# Patient Record
Sex: Male | Born: 1970 | Race: White | Hispanic: No | Smoking: Former smoker
Health system: Southern US, Community
[De-identification: ages and names within clinical notes are randomized; demographics above are authoritative.]

---

## 1999-10-10 ENCOUNTER — Emergency Department (HOSPITAL_COMMUNITY): Admission: EM | Admit: 1999-10-10 | Discharge: 1999-10-10 | Payer: Self-pay | Admitting: Emergency Medicine

## 1999-10-10 ENCOUNTER — Encounter: Payer: Self-pay | Admitting: Emergency Medicine

## 2003-07-15 ENCOUNTER — Ambulatory Visit (HOSPITAL_COMMUNITY): Admission: RE | Admit: 2003-07-15 | Discharge: 2003-07-15 | Payer: Self-pay | Admitting: Internal Medicine

## 2019-03-31 ENCOUNTER — Other Ambulatory Visit: Payer: Self-pay

## 2019-03-31 DIAGNOSIS — Z20822 Contact with and (suspected) exposure to covid-19: Secondary | ICD-10-CM

## 2019-04-01 LAB — NOVEL CORONAVIRUS, NAA: SARS-CoV-2, NAA: NOT DETECTED

## 2020-01-29 ENCOUNTER — Encounter (HOSPITAL_BASED_OUTPATIENT_CLINIC_OR_DEPARTMENT_OTHER): Payer: Self-pay

## 2020-01-29 ENCOUNTER — Other Ambulatory Visit: Payer: Self-pay

## 2020-01-29 ENCOUNTER — Emergency Department (HOSPITAL_BASED_OUTPATIENT_CLINIC_OR_DEPARTMENT_OTHER): Payer: 59

## 2020-01-29 ENCOUNTER — Inpatient Hospital Stay (HOSPITAL_BASED_OUTPATIENT_CLINIC_OR_DEPARTMENT_OTHER)
Admission: EM | Admit: 2020-01-29 | Discharge: 2020-02-05 | DRG: 177 | Disposition: A | Discharge status: Home / Self Care | Payer: 59 | Attending: Internal Medicine | Admitting: Internal Medicine

## 2020-01-29 DIAGNOSIS — E860 Dehydration: Secondary | ICD-10-CM | POA: Diagnosis present

## 2020-01-29 DIAGNOSIS — R112 Nausea with vomiting, unspecified: Secondary | ICD-10-CM | POA: Diagnosis present

## 2020-01-29 DIAGNOSIS — R197 Diarrhea, unspecified: Secondary | ICD-10-CM | POA: Diagnosis present

## 2020-01-29 DIAGNOSIS — Z833 Family history of diabetes mellitus: Secondary | ICD-10-CM

## 2020-01-29 DIAGNOSIS — U071 COVID-19: Secondary | ICD-10-CM | POA: Diagnosis not present

## 2020-01-29 DIAGNOSIS — J9601 Acute respiratory failure with hypoxia: Secondary | ICD-10-CM | POA: Diagnosis not present

## 2020-01-29 DIAGNOSIS — E873 Alkalosis: Secondary | ICD-10-CM | POA: Diagnosis present

## 2020-01-29 DIAGNOSIS — E871 Hypo-osmolality and hyponatremia: Secondary | ICD-10-CM | POA: Diagnosis present

## 2020-01-29 DIAGNOSIS — J1282 Pneumonia due to coronavirus disease 2019: Secondary | ICD-10-CM | POA: Diagnosis present

## 2020-01-29 LAB — CBC WITH DIFFERENTIAL/PLATELET
Abs Immature Granulocytes: 0.03 10*3/uL (ref 0.00–0.07)
Basophils Absolute: 0 10*3/uL (ref 0.0–0.1)
Basophils Relative: 0 %
Eosinophils Absolute: 0 10*3/uL (ref 0.0–0.5)
Eosinophils Relative: 0 %
HCT: 45.5 % (ref 39.0–52.0)
Hemoglobin: 15.6 g/dL (ref 13.0–17.0)
Immature Granulocytes: 1 %
Lymphocytes Relative: 12 %
Lymphs Abs: 0.7 10*3/uL (ref 0.7–4.0)
MCH: 31.6 pg (ref 26.0–34.0)
MCHC: 34.3 g/dL (ref 30.0–36.0)
MCV: 92.3 fL (ref 80.0–100.0)
Monocytes Absolute: 0.4 10*3/uL (ref 0.1–1.0)
Monocytes Relative: 6 %
Neutro Abs: 4.8 10*3/uL (ref 1.7–7.7)
Neutrophils Relative %: 81 %
Platelets: 215 10*3/uL (ref 150–400)
RBC: 4.93 MIL/uL (ref 4.22–5.81)
RDW: 12.9 % (ref 11.5–15.5)
WBC: 5.8 10*3/uL (ref 4.0–10.5)
nRBC: 0 % (ref 0.0–0.2)

## 2020-01-29 LAB — COMPREHENSIVE METABOLIC PANEL
ALT: 163 U/L — ABNORMAL HIGH (ref 0–44)
AST: 162 U/L — ABNORMAL HIGH (ref 15–41)
Albumin: 3.2 g/dL — ABNORMAL LOW (ref 3.5–5.0)
Alkaline Phosphatase: 86 U/L (ref 38–126)
Anion gap: 11 (ref 5–15)
BUN: 10 mg/dL (ref 6–20)
CO2: 31 mmol/L (ref 22–32)
Calcium: 8.1 mg/dL — ABNORMAL LOW (ref 8.9–10.3)
Chloride: 88 mmol/L — ABNORMAL LOW (ref 98–111)
Creatinine, Ser: 0.96 mg/dL (ref 0.61–1.24)
GFR calc Af Amer: >60 mL/min (ref >60)
GFR calc non Af Amer: >60 mL/min (ref >60)
Glucose, Bld: 117 mg/dL — ABNORMAL HIGH (ref 70–99)
Potassium: 3.7 mmol/L (ref 3.5–5.1)
Sodium: 130 mmol/L — ABNORMAL LOW (ref 135–145)
Total Bilirubin: 0.9 mg/dL (ref 0.3–1.2)
Total Protein: 7.6 g/dL (ref 6.5–8.1)

## 2020-01-29 LAB — LACTIC ACID, PLASMA: Lactic Acid, Venous: 1.7 mmol/L (ref 0.5–1.9)

## 2020-01-29 LAB — D-DIMER, QUANTITATIVE: D-Dimer, Quant: 0.86 ug/mL-FEU — ABNORMAL HIGH (ref 0.00–0.50)

## 2020-01-29 IMAGING — DX DG CHEST 1V PORT
1 series · 1 of 1 positions shown · non-contrast
Comparison: None.

CLINICAL DATA: [5C] positive, shortness of breath

EXAM:
PORTABLE CHEST 1 VIEW

[chest ap]
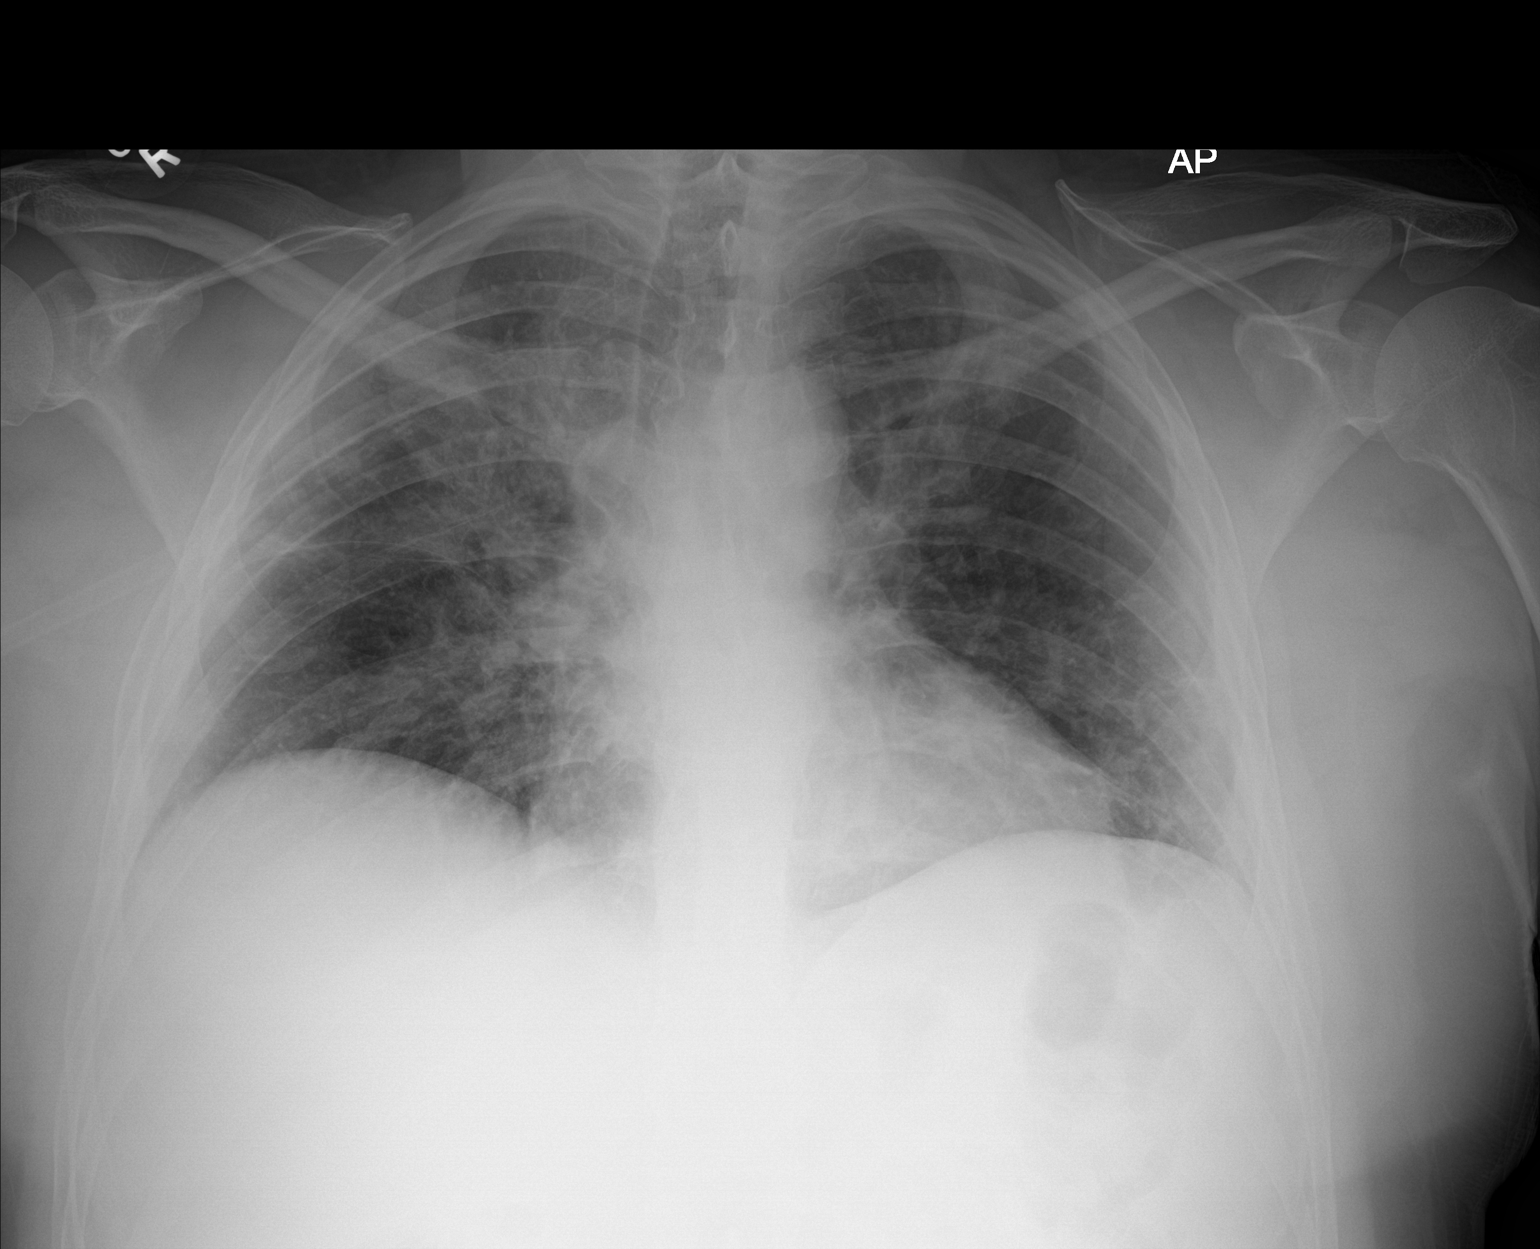

[1 of 1 positions shown; findings below may reference images not displayed]

FINDINGS: Single frontal view of the chest demonstrates multifocal airspace
disease greatest in the right upper lung zone. No effusion or
pneumothorax. Cardiac silhouette is unremarkable.
IMPRESSION: 1. Multifocal bilateral pneumonia compatible with COVID 19.

## 2020-01-29 MED ORDER — ONDANSETRON HCL 4 MG/2ML IJ SOLN
4.0000 mg | Freq: Once | INTRAMUSCULAR | Status: AC
  Administered 2020-01-29: 4 mg via INTRAVENOUS
  Filled 2020-01-29: qty 2

## 2020-01-29 MED ORDER — SODIUM CHLORIDE 0.9 % IV SOLN
100.0000 mg | Freq: Every day | INTRAVENOUS | Status: AC
  Administered 2020-01-30 – 2020-02-02 (×4): 100 mg via INTRAVENOUS
  Filled 2020-01-29 (×4): qty 20

## 2020-01-29 MED ORDER — DEXAMETHASONE SODIUM PHOSPHATE 10 MG/ML IJ SOLN
10.0000 mg | Freq: Once | INTRAMUSCULAR | Status: AC
  Administered 2020-01-29: 10 mg via INTRAVENOUS
  Filled 2020-01-29: qty 1

## 2020-01-29 MED ORDER — ACETAMINOPHEN 500 MG PO TABS
1000.0000 mg | ORAL_TABLET | Freq: Once | ORAL | Status: AC
  Administered 2020-01-29: 1000 mg via ORAL
  Filled 2020-01-29: qty 2

## 2020-01-29 MED ORDER — SODIUM CHLORIDE 0.9 % IV SOLN
100.0000 mg | Freq: Once | INTRAVENOUS | Status: AC
  Administered 2020-01-29: 100 mg via INTRAVENOUS
  Filled 2020-01-29: qty 20

## 2020-01-29 NOTE — ED Triage Notes (Addendum)
Pt c/o SOB x today-states he tested +covid 9/15-states O2 sat was staying mid to high 90s- today was 85-NAD-steady gait

## 2020-01-29 NOTE — ED Notes (Signed)
Patient placed on 3L [place] with humidity to maintain an oxygen saturation of 93-95%. RR 20, Will access and make necessary changes.

## 2020-01-29 NOTE — ED Provider Notes (Signed)
[facility] EMERGENCY DEPARTMENT Provider Note   CSN: [id number] Arrival date & time: 01/29/20  1909     History Chief Complaint  Patient presents with  . Shortness of Breath    JAHMAI FINELLI is a 49 y.o. male.  HPI    49 year old male presenting for evaluation of shortness of breath.  States that he was diagnosed with Covid on 9/15.  He became symptomatic on the 14th.  He reports that he has had fevers, nausea, vomiting, diarrhea, cough, shortness of breath.  States that symptoms worsened today and shortness of breath was more severe.  His pulse ox at home was reading in the 90s.  He denies any chest pain unless he is coughing.  He is not vaccinated and has not received a Mab infusion.   History reviewed. No pertinent past medical history.  Patient Active Problem List   Diagnosis Date Noted  . Acute hypoxemic respiratory failure due to COVID-19 [facility]) 01/29/2020    History reviewed. No pertinent surgical history.     No family history on file.  Social History   Tobacco Use  . Smoking status: Never Smoker  . Smokeless tobacco: Never Used  Vaping Use  . Vaping Use: Never used  Substance Use Topics  . Alcohol use: Yes    Comment: occ  . Drug use: Never    Home Medications Prior to Admission medications   Medication Sig Start Date End Date Taking? Authorizing Provider  albuterol (VENTOLIN HFA) 108 (90 Base) MCG/ACT inhaler  01/27/20   [provider]    Allergies    Patient has no known allergies.  Review of Systems   Review of Systems  Constitutional: Positive for fever.  HENT: Negative for ear pain and sore throat.   Eyes: Negative for pain and visual disturbance.  Respiratory: Positive for cough and shortness of breath.   Cardiovascular: Negative for chest pain.  Gastrointestinal: Positive for diarrhea and vomiting. Negative for abdominal pain and nausea.  Genitourinary: Negative for dysuria and hematuria.  Musculoskeletal: Negative for  back pain.  Skin: Negative for rash.  Neurological: Positive for headaches. Negative for seizures and syncope.  All other systems reviewed and are negative.   Physical Exam Updated Vital Signs BP 136/90   Pulse (!) 107   Temp 99.9 F (37.7 C) (Oral)   Resp 14   Ht 5\' 9"  (1.753 m)   Wt 95.3 kg   SpO2 94%   BMI 31.01 kg/m   Physical Exam Vitals and nursing note reviewed.  Constitutional:      Appearance: He is well-developed.  HENT:     Head: Normocephalic and atraumatic.  Eyes:     Conjunctiva/sclera: Conjunctivae normal.  Cardiovascular:     Rate and Rhythm: Regular rhythm. Tachycardia present.     Heart sounds: Normal heart sounds. No murmur heard.   Pulmonary:     Effort: Tachypnea present. No respiratory distress.     Breath sounds: Examination of the right-middle field reveals rales. Examination of the left-middle field reveals rales. Examination of the right-lower field reveals rales. Examination of the left-lower field reveals rales. Rales present.     Comments: Speaking in 1-2 word sentences Abdominal:     Palpations: Abdomen is soft.     Tenderness: There is no abdominal tenderness. There is no guarding or rebound.  Musculoskeletal:     Cervical back: Neck supple.  Skin:    General: Skin is warm and dry.  Neurological:  Mental Status: He is alert.     ED Results / Procedures / Treatments   Labs (all labs ordered are listed, but only abnormal results are displayed) Labs Reviewed  COMPREHENSIVE METABOLIC PANEL - Abnormal; Notable for the following components:      Result Value   Sodium 130 (*)    Chloride 88 (*)    Glucose, Bld 117 (*)    Calcium 8.1 (*)    Albumin 3.2 (*)    AST 162 (*)    ALT 163 (*)    All other components within normal limits  D-DIMER, QUANTITATIVE (NOT AT [facility]) - Abnormal; Notable for the following components:   D-Dimer, Quant 0.86 (*)    All other components within normal limits  SARS CORONAVIRUS 2 BY RT PCR (HOSPITAL  ORDER, PERFORMED IN [place] [facility])  CULTURE, BLOOD (ROUTINE X 2)  CULTURE, BLOOD (ROUTINE X 2)  LACTIC ACID, PLASMA  CBC WITH DIFFERENTIAL/PLATELET  LACTIC ACID, PLASMA  PROCALCITONIN  LACTATE DEHYDROGENASE  FERRITIN  TRIGLYCERIDES  FIBRINOGEN  C-REACTIVE PROTEIN    EKG EKG Interpretation  Date/Time:  Friday January 29 2020 21:46:37 EDT Ventricular Rate:  111 PR Interval:    QRS Duration: 96 QT Interval:  342 QTC Calculation: 465 R Axis:   -76 Text Interpretation: Sinus tachycardia Left anterior fascicular block No old tracing to compare Confirmed by Susy Frizzle [phone]) on 01/29/2020 10:06:14 PM   Radiology DG Chest Portable 1 View  Result Date: 01/29/2020 CLINICAL DATA:  COVID-19 positive, shortness of breath EXAM: PORTABLE CHEST 1 VIEW COMPARISON:  None. FINDINGS: Single frontal view of the chest demonstrates multifocal airspace disease greatest in the right upper lung zone. No effusion or pneumothorax. Cardiac silhouette is unremarkable. IMPRESSION: 1. Multifocal bilateral pneumonia compatible with COVID 19. Electronically Signed   By: Sharlet Salina M.D.   On: 01/29/2020 21:49    Procedures Procedures (including critical care time)  CRITICAL CARE Performed by: Karrie Meres   Total critical care time: 31 minutes  Critical care time was exclusive of separately billable procedures and treating other patients.  Critical care was necessary to treat or prevent imminent or life-threatening deterioration.  Critical care was time spent personally by me on the following activities: development of treatment plan with patient and/or surrogate as well as nursing, discussions with consultants, evaluation of patient's response to treatment, examination of patient, obtaining history from patient or surrogate, ordering and performing treatments and interventions, ordering and review of laboratory studies, ordering and review of radiographic studies, pulse  oximetry and re-evaluation of patient's condition.   Medications Ordered in ED Medications  acetaminophen (TYLENOL) tablet 1,000 mg (has no administration in time range)  dexamethasone (DECADRON) injection 10 mg (has no administration in time range)  ondansetron (ZOFRAN) injection 4 mg (has no administration in time range)  remdesivir 100 mg in sodium chloride 0.9 % 100 mL IVPB (has no administration in time range)    Followed by  remdesivir 100 mg in sodium chloride 0.9 % 100 mL IVPB (has no administration in time range)    Followed by  remdesivir 100 mg in sodium chloride 0.9 % 100 mL IVPB (has no administration in time range)    ED Course  I have reviewed the triage vital signs and the nursing notes.  Pertinent labs & imaging results that were available during my care of the patient were reviewed by me and considered in my medical decision making (see chart for details).    MDM Rules/Calculators/A&P  49 year old male with known Covid infection presenting for evaluation of shortness of breath.  On arrival sats in the 80s on room air.  Requiring 4 L of fluid and later was placed on humidified O2.  Reviewed/interpreted labs CBC is without leukocytosis or anemia CMP with hyponatremia and hypochloremia.  Creatinine within normal limits.  LFTs are somewhat elevated but bilirubin is normal Lactic acid negative  EKG - Sinus tachycardia Left anterior fascicular block No old tracing to compare   Chest x-ray reviewed/interpreted - Multifocal bilateral pneumonia compatible with COVID 19.  Patient with Covid with acute hypoxic respiratory failure.  We will plan for admission for further treatment. Decadron, remdesivir, tylenol and zofran given in the ED.   10:38 PM CONSULT with Dr. Antionette Char with hospitalist service who accepts patient for admission   -----  Waymond Cera was evaluated in Emergency Department on 01/29/2020 for the symptoms described in the history of  present illness. He was evaluated in the context of the global COVID-19 pandemic, which necessitated consideration that the patient might be at risk for infection with the SARS-CoV-2 virus that causes COVID-19. Institutional protocols and algorithms that pertain to the evaluation of patients at risk for COVID-19 are in a state of rapid change based on information released by regulatory bodies including the CDC and federal and state organizations. These policies and algorithms were followed during the patient's care in the ED.   Final Clinical Impression(s) / ED Diagnoses Final diagnoses:  Acute respiratory failure with hypoxia [facility])  COVID-19    Rx / DC Orders ED Discharge Orders    None       Rayne Du 01/29/20 2239    Pollyann Savoy, MD 01/29/20 [phone]

## 2020-01-30 ENCOUNTER — Encounter (HOSPITAL_COMMUNITY): Payer: Self-pay | Admitting: Family Medicine

## 2020-01-30 DIAGNOSIS — E860 Dehydration: Secondary | ICD-10-CM

## 2020-01-30 DIAGNOSIS — R112 Nausea with vomiting, unspecified: Secondary | ICD-10-CM | POA: Diagnosis present

## 2020-01-30 DIAGNOSIS — E871 Hypo-osmolality and hyponatremia: Secondary | ICD-10-CM | POA: Diagnosis present

## 2020-01-30 DIAGNOSIS — U071 COVID-19: Secondary | ICD-10-CM | POA: Diagnosis present

## 2020-01-30 DIAGNOSIS — J9601 Acute respiratory failure with hypoxia: Secondary | ICD-10-CM | POA: Diagnosis present

## 2020-01-30 DIAGNOSIS — J1282 Pneumonia due to coronavirus disease 2019: Secondary | ICD-10-CM | POA: Diagnosis present

## 2020-01-30 DIAGNOSIS — E873 Alkalosis: Secondary | ICD-10-CM | POA: Diagnosis present

## 2020-01-30 DIAGNOSIS — Z833 Family history of diabetes mellitus: Secondary | ICD-10-CM | POA: Diagnosis not present

## 2020-01-30 DIAGNOSIS — R197 Diarrhea, unspecified: Secondary | ICD-10-CM | POA: Diagnosis present

## 2020-01-30 LAB — HIV ANTIBODY (ROUTINE TESTING W REFLEX): HIV Screen 4th Generation wRfx: NONREACTIVE

## 2020-01-30 LAB — MAGNESIUM: Magnesium: 2.8 mg/dL — ABNORMAL HIGH (ref 1.7–2.4)

## 2020-01-30 LAB — CBC WITH DIFFERENTIAL/PLATELET
Abs Immature Granulocytes: 0.02 10*3/uL (ref 0.00–0.07)
Basophils Absolute: 0 10*3/uL (ref 0.0–0.1)
Basophils Relative: 0 %
Eosinophils Absolute: 0 10*3/uL (ref 0.0–0.5)
Eosinophils Relative: 0 %
HCT: 44.3 % (ref 39.0–52.0)
Hemoglobin: 15.3 g/dL (ref 13.0–17.0)
Immature Granulocytes: 0 %
Lymphocytes Relative: 8 %
Lymphs Abs: 0.5 10*3/uL — ABNORMAL LOW (ref 0.7–4.0)
MCH: 32 pg (ref 26.0–34.0)
MCHC: 34.5 g/dL (ref 30.0–36.0)
MCV: 92.7 fL (ref 80.0–100.0)
Monocytes Absolute: 0.3 10*3/uL (ref 0.1–1.0)
Monocytes Relative: 5 %
Neutro Abs: 4.8 10*3/uL (ref 1.7–7.7)
Neutrophils Relative %: 87 %
Platelets: 222 10*3/uL (ref 150–400)
RBC: 4.78 MIL/uL (ref 4.22–5.81)
RDW: 13 % (ref 11.5–15.5)
WBC: 5.5 10*3/uL (ref 4.0–10.5)
nRBC: 0 % (ref 0.0–0.2)

## 2020-01-30 LAB — COMPREHENSIVE METABOLIC PANEL
ALT: 145 U/L — ABNORMAL HIGH (ref 0–44)
AST: 131 U/L — ABNORMAL HIGH (ref 15–41)
Albumin: 3.1 g/dL — ABNORMAL LOW (ref 3.5–5.0)
Alkaline Phosphatase: 85 U/L (ref 38–126)
Anion gap: 11 (ref 5–15)
BUN: 11 mg/dL (ref 6–20)
CO2: 28 mmol/L (ref 22–32)
Calcium: 8.2 mg/dL — ABNORMAL LOW (ref 8.9–10.3)
Chloride: 92 mmol/L — ABNORMAL LOW (ref 98–111)
Creatinine, Ser: 0.73 mg/dL (ref 0.61–1.24)
GFR calc Af Amer: >60 mL/min (ref >60)
GFR calc non Af Amer: >60 mL/min (ref >60)
Glucose, Bld: 166 mg/dL — ABNORMAL HIGH (ref 70–99)
Potassium: 4 mmol/L (ref 3.5–5.1)
Sodium: 131 mmol/L — ABNORMAL LOW (ref 135–145)
Total Bilirubin: 0.7 mg/dL (ref 0.3–1.2)
Total Protein: 7.1 g/dL (ref 6.5–8.1)

## 2020-01-30 LAB — FERRITIN: Ferritin: 5736 ng/mL — ABNORMAL HIGH (ref 24–336)

## 2020-01-30 LAB — C-REACTIVE PROTEIN
CRP: 9.5 mg/dL — ABNORMAL HIGH (ref <1.0)
CRP: 11 mg/dL — ABNORMAL HIGH (ref <1.0)

## 2020-01-30 LAB — LACTIC ACID, PLASMA: Lactic Acid, Venous: 1.1 mmol/L (ref 0.5–1.9)

## 2020-01-30 LAB — TRIGLYCERIDES: Triglycerides: 291 mg/dL — ABNORMAL HIGH (ref <150)

## 2020-01-30 LAB — PROCALCITONIN: Procalcitonin: 0.33 ng/mL

## 2020-01-30 LAB — D-DIMER, QUANTITATIVE: D-Dimer, Quant: 0.78 ug/mL-FEU — ABNORMAL HIGH (ref 0.00–0.50)

## 2020-01-30 LAB — SARS CORONAVIRUS 2 BY RT PCR (HOSPITAL ORDER, PERFORMED IN ~~LOC~~ HOSPITAL LAB): SARS Coronavirus 2: POSITIVE — AB

## 2020-01-30 LAB — FIBRINOGEN: Fibrinogen: 529 mg/dL — ABNORMAL HIGH (ref 210–475)

## 2020-01-30 LAB — LACTATE DEHYDROGENASE: LDH: 577 U/L — ABNORMAL HIGH (ref 98–192)

## 2020-01-30 MED ORDER — ALBUTEROL SULFATE HFA 108 (90 BASE) MCG/ACT IN AERS: 2.0000 | INHALATION_SPRAY | RESPIRATORY_TRACT | Status: DC | PRN

## 2020-01-30 MED ORDER — ENOXAPARIN SODIUM 40 MG/0.4ML ~~LOC~~ SOLN
40.0000 mg | SUBCUTANEOUS | Status: DC
  Administered 2020-01-30 – 2020-02-05 (×7): 40 mg via SUBCUTANEOUS
  Filled 2020-01-30 (×7): qty 0.4

## 2020-01-30 MED ORDER — ONDANSETRON HCL 4 MG PO TABS: 4.0000 mg | ORAL_TABLET | Freq: Four times a day (QID) | ORAL | Status: DC | PRN

## 2020-01-30 MED ORDER — DEXAMETHASONE SODIUM PHOSPHATE 10 MG/ML IJ SOLN: 6.0000 mg | INTRAMUSCULAR | Status: DC

## 2020-01-30 MED ORDER — ZINC SULFATE 220 (50 ZN) MG PO CAPS
220.0000 mg | ORAL_CAPSULE | Freq: Every day | ORAL | Status: DC
  Administered 2020-01-30 – 2020-02-05 (×7): 220 mg via ORAL
  Filled 2020-01-30 (×7): qty 1

## 2020-01-30 MED ORDER — ONDANSETRON HCL 4 MG/2ML IJ SOLN: 4.0000 mg | Freq: Four times a day (QID) | INTRAMUSCULAR | Status: DC | PRN

## 2020-01-30 MED ORDER — ACETAMINOPHEN 325 MG PO TABS
650.0000 mg | ORAL_TABLET | Freq: Four times a day (QID) | ORAL | Status: DC | PRN
  Administered 2020-01-31: 650 mg via ORAL
  Filled 2020-01-30 (×2): qty 2

## 2020-01-30 MED ORDER — POLYETHYLENE GLYCOL 3350 17 G PO PACK: 17.0000 g | PACK | Freq: Every day | ORAL | Status: DC | PRN

## 2020-01-30 MED ORDER — ASCORBIC ACID 500 MG PO TABS
500.0000 mg | ORAL_TABLET | Freq: Every day | ORAL | Status: DC
  Administered 2020-01-30 – 2020-02-05 (×7): 500 mg via ORAL
  Filled 2020-01-30 (×6): qty 1

## 2020-01-30 MED ORDER — METHYLPREDNISOLONE SODIUM SUCC 125 MG IJ SOLR
50.0000 mg | Freq: Two times a day (BID) | INTRAMUSCULAR | Status: DC
  Administered 2020-01-30 – 2020-02-02 (×7): 50 mg via INTRAVENOUS
  Filled 2020-01-30 (×7): qty 2

## 2020-01-30 MED ORDER — BARICITINIB 2 MG PO TABS
4.0000 mg | ORAL_TABLET | Freq: Every day | ORAL | Status: DC
  Administered 2020-01-30 – 2020-02-05 (×7): 4 mg via ORAL
  Filled 2020-01-30 (×7): qty 2

## 2020-01-30 MED ORDER — GUAIFENESIN-DM 100-10 MG/5ML PO SYRP
10.0000 mL | ORAL_SOLUTION | ORAL | Status: DC | PRN
  Administered 2020-01-30 – 2020-01-31 (×2): 10 mL via ORAL
  Filled 2020-01-30 (×3): qty 10

## 2020-01-30 MED ORDER — IPRATROPIUM-ALBUTEROL 20-100 MCG/ACT IN AERS
1.0000 | INHALATION_SPRAY | Freq: Four times a day (QID) | RESPIRATORY_TRACT | Status: DC
  Administered 2020-01-30 – 2020-01-31 (×3): 1 via RESPIRATORY_TRACT
  Filled 2020-01-30: qty 4

## 2020-01-30 MED ORDER — LACTATED RINGERS IV SOLN: INTRAVENOUS | Status: DC

## 2020-01-30 NOTE — Progress Notes (Signed)
PROGRESS NOTE    Seth Lee  YJE:[id number] DOB: 18-Nov-1970 DOA: 01/29/2020 PCP: Pcp, No    Brief Narrative:  Patient admitted to the hospital working diagnosis of acute hypoxic respiratory failure due to SARS COVID-19 viral pneumonia.  49 year old male with no significant past medical history, transferred from [facility] due to acute hypoxic respiratory failure.  Patient was diagnosed with COVID-19 September 15, positive sick contacts at home.  Patient developed progressive dyspnea to the point where he became dyspneic with minimal efforts.  Persistent dry cough, generalized malaise, loss of taste and smell and poor oral intake.  He was seen at [facility] where he was found hypoxic, requiring 3 L of submental oxygen, blood pressure 110/85, heart rate 92, respiratory 25-29, temperature 98.6, oxygen saturation 92% on supplemental oxygen, lungs with Rales at bases bilaterally, no wheezing, heart S1-S2, present rhythmic, soft abdomen, no lower extremity edema. Sodium 130, potassium 3.7, chloride 88, bicarb 31, glucose 117, BUN 10, creatinine 0.96, white count 5.8, hemoglobin 15.6, hematocrit 45.5, platelets 215.  SARS COVID-19 positive. Chest radiograph with bilateral interstitial infiltrates, predominantly right upper lobe / left lower lobe and faint infiltrate at the right lower lobe.    Assessment & Plan:   Principal Problem:   Pneumonia due to COVID-19 virus Active Problems:   Acute respiratory failure with hypoxia (HCC)   Dehydration with hyponatremia   1. Acute hypoxic respiratory failure due to SARS COVID 19 viral pneumonia.    RR: 22  Pulse oxymetry: 89% to 92%  Fi02: 3L/min per [place]   COVID-19 Labs  Recent Labs    01/29/20 2120 01/30/20 0513  DDIMER 0.86* 0.78*  FERRITIN 5,736*  --   LDH 577*  --   CRP 9.5* 11.0*    Lab Results  Component Value Date   SARSCOV2NAA POSITIVE (A) 01/29/2020   SARSCOV2NAA Not Detected 03/31/2019   Patient with  high inflammatory markers and bilateral multilobar interstitial infiltrates.   In the setting of high risk for worsening hypoxic respiratory failure, I explained the patient in detail, the use of advanced therapy: baricitinib.  I informed him the risks of this interventions including immunosuppression, opportunistic infections, allergic reactions and others.  I explained him the experimental nature of this intervention, in the face of current COVID 19 pandemic, along with the potential benefits of improvement of hypoxic respiratory failure and systemic inflammation.  He has agreed to proceed and consent was signed.  Start baricitinib 4 mg po daily, change steroid therapy to methylprednisolone and continue antiviral therapy with Remdesivir.  Continue aggressive bronchodilator therapy, antitussive agents and airway clearing techniques with flutter valve and incentive spirometer.   Follow on inflammatory markers.   2. Hyponatremia, dehydration and metabolic alkalosis. Stable renal function with serum cr at 0.73, K up to 4,0 and Na 131, bicarbonate 28.  Patient is tolerating po, no nausea or vomiting, will dc IV fluids for now in the setting of acute viral pneumonia.     Patient continue to be at high risk for worsening hypoxemic respiratory failure   Status is: Inpatient  Remains inpatient appropriate because:IV treatments appropriate due to intensity of illness or inability to take PO   Dispo: The patient is from: Home              Anticipated d/c is to: Home              Anticipated d/c date is: > 3 days  Patient currently is not medically stable to d/c.   DVT prophylaxis: Enoxaparin   Code Status:   full  Family Communication:  Not able to reach his sister Mrs. Berkheart [phone]     Subjective: Patient continue to be very fatigue and weak, no nausea or vomiting, no chest pain, continue to have dyspnea.   Objective: Vitals:   01/30/20 0200 01/30/20 0300 01/30/20  0346 01/30/20 0820  BP: 106/75 110/85 133/83 132/85  Pulse: 87 92 92 95  Resp: 10 (!) 29 (!) 25 (!) 22  Temp:   98.6 F (37 C) 98.9 F (37.2 C)  TempSrc:    Oral  SpO2: 95% 96% 92% (!) 89%  Weight:      Height:        Intake/Output Summary (Last 24 hours) at 01/30/2020 0905 Last data filed at 01/30/2020 0026 Gross per 24 hour  Intake 199.29 ml  Output --  Net 199.29 ml   Filed Weights   01/29/20 1929  Weight: 95.3 kg    Examination:   General: deconditioned and ill looking appearing  Neurology: Awake and alert, non focal  E ENT: no pallor, no icterus, oral mucosa moist Cardiovascular: No JVD. S1-S2 present, rhythmic, no gallops, rubs, or murmurs. No lower extremity edema. Pulmonary: positive breath sounds bilaterally, with no wheezing, rhonchi or rales. Gastrointestinal. Abdomen soft and non tender Skin. No rashes Musculoskeletal: no joint deformities     Data Reviewed: I have personally reviewed following labs and imaging studies  CBC: Recent Labs  Lab 01/29/20 2120 01/30/20 0513  WBC 5.8 5.5  NEUTROABS 4.8 4.8  HGB 15.6 15.3  HCT 45.5 44.3  MCV 92.3 92.7  PLT 215 222   Basic Metabolic Panel: Recent Labs  Lab 01/29/20 2120 01/30/20 0513  NA 130* 131*  K 3.7 4.0  CL 88* 92*  CO2 31 28  GLUCOSE 117* 166*  BUN 10 11  CREATININE 0.96 0.73  CALCIUM 8.1* 8.2*  MG  --  2.8*   GFR: Estimated Creatinine Clearance: 127.2 mL/min (by C-G formula based on SCr of 0.73 mg/dL). Liver Function Tests: Recent Labs  Lab 01/29/20 2120 01/30/20 0513  AST 162* 131*  ALT 163* 145*  ALKPHOS 86 85  BILITOT 0.9 0.7  PROT 7.6 7.1  ALBUMIN 3.2* 3.1*   No results for input(s): LIPASE, AMYLASE in the last 168 hours. No results for input(s): AMMONIA in the last 168 hours. Coagulation Profile: No results for input(s): INR, PROTIME in the last 168 hours. Cardiac Enzymes: No results for input(s): CKTOTAL, CKMB, CKMBINDEX, TROPONINI in the last 168 hours. BNP (last  3 results) No results for input(s): PROBNP in the last 8760 hours. HbA1C: No results for input(s): HGBA1C in the last 72 hours. CBG: No results for input(s): GLUCAP in the last 168 hours. Lipid Profile: Recent Labs    01/29/20 2120  TRIG 291*   Thyroid Function Tests: No results for input(s): TSH, T4TOTAL, FREET4, T3FREE, THYROIDAB in the last 72 hours. Anemia Panel: Recent Labs    01/29/20 2120  FERRITIN 5,736*      Radiology Studies: I have reviewed all of the imaging during this hospital visit personally     Scheduled Meds: . vitamin C  500 mg Oral Daily  . dexamethasone (DECADRON) injection  6 mg Intravenous Q24H  . enoxaparin (LOVENOX) injection  40 mg Subcutaneous Q24H  . zinc sulfate  220 mg Oral Daily   Continuous Infusions: . lactated ringers 125 mL/hr at 01/30/20 0647  .  remdesivir 100 mg in NS 100 mL       LOS: 0 days        Perina Salvaggio Annett Gula, MD

## 2020-01-30 NOTE — Plan of Care (Signed)
Patient is alert and oriented. Patient has been calm, cooperative, and very responsive today. Patient was working very hard to maintain adequate oxygenation therefore he was bumped up from 3L to 4L humidified [place] were patient is maintaining 88% to 91%. Patient is having little to no success with his Probation officer.  Will report to nurse Thayer Ohm RN  Problem: Education: Goal: Knowledge of General Education information will improve Description: Including pain rating scale, medication(s)/side effects and non-pharmacologic comfort measures Outcome: Progressing   Problem: Skin Integrity: Goal: Risk for impaired skin integrity will decrease Outcome: Progressing   Problem: Clinical Measurements: Goal: Diagnostic test results will improve Outcome: Not Progressing Goal: Respiratory complications will improve Outcome: Not Progressing

## 2020-01-30 NOTE — H&P (Addendum)
History and Physical    Seth Lee NWG:[id number] DOB: 1971/04/18 DOA: 01/29/2020  PCP: Pcp, No  Patient coming from: Home - [facility]   Chief Complaint:  Chief Complaint  Patient presents with  . Shortness of Breath     HPI:    49 year old male with no significant past medical history presenting to med [facility] emergency department planes of shortness of breath and cough.  Patient explains that on September 14 several days after he came in contact with both his daughter and mother who both were diagnosed with COVID-19 he began to experience cough and mild shortness of breath.  On September 15, presented to a local clinic where he was diagnosed with COVID-19 infection and was sent home for conservative management.  The days that followed, patient explains that he developed progressively worsening shortness of breath. Initially the shortness of breath was mild in severity but progressively became more severe. Shortness of breath is worse with exertion and improved with rest. With patient's worsening shortness of breath patient developed worsening nonproductive cough. Patient also complains of generalized muscle aches, lack of taste, lack of smell and poor appetite. As the patient's symptoms continue to worsen he developed associated worsening generalized weakness. Patient also complains of intermittent fevers throughout the span of time. Patient also endorses occasional bouts of watery diarrhea.  Patient symptoms continue to worsen until he eventually presented to med [facility] emergency department for evaluation. Upon evaluation at [facility], patient was found to be hypoxic requiring supplemental oxygen with 3 L of oxygen via nasal cannula. Confirmatory Covid PCR testing was performed and came back positive. Chest x-ray was performed revealing patchy bilateral infiltrates consistent with COVID-19 pneumonia. Patient was initiated on intravenous remdesivir and  intravenous steroids and the hospitalist group was then called to to accept the patient to [facility] long hospital for admission.  Review of Systems:   Review of Systems  Constitutional: Positive for fever and malaise/fatigue.  Respiratory: Positive for cough and shortness of breath.   Gastrointestinal: Positive for diarrhea.  Neurological: Positive for weakness.  All other systems reviewed and are negative.   History reviewed. No pertinent past medical history.  History reviewed. No pertinent surgical history.   reports that he has never smoked. He has never used smokeless tobacco. He reports current alcohol use. He reports that he does not use drugs.  No Known Allergies  Family History  Problem Relation Age of Onset  . Diabetes Father      Prior to Admission medications   Medication Sig Start Date End Date Taking? Authorizing Provider  albuterol (VENTOLIN HFA) 108 (90 Base) MCG/ACT inhaler  01/27/20   [provider]    Physical Exam: Vitals:   01/30/20 0130 01/30/20 0200 01/30/20 0300 01/30/20 0346  BP: 103/70 106/75 110/85 133/83  Pulse: 89 87 92 92  Resp: 19 10 (!) 29 (!) 25  Temp:    98.6 F (37 C)  TempSrc:      SpO2: 94% 95% 96% 92%  Weight:      Height:        Constitutional: Acute alert and oriented x3, no associated distress.   Skin: no rashes, no lesions, good skin turgor noted. Eyes: Pupils are equally reactive to light.  No evidence of scleral icterus or conjunctival pallor.  ENMT: Moist mucous membranes noted.  Posterior pharynx clear of any exudate or lesions.   Neck: normal, supple, no masses, no thyromegaly.  No evidence of jugular  venous distension.   Respiratory: Mild rales in the bilateral bases. No wheezing noted. Normal respiratory effort. No accessory muscle use.  Cardiovascular: Regular rate and rhythm, no murmurs / rubs / gallops. No extremity edema. 2+ pedal pulses. No carotid bruits.  Chest:   Nontender without crepitus or  deformity.   Back:   Nontender without crepitus or deformity. Abdomen: Abdomen is soft and nontender.  No evidence of intra-abdominal masses.  Positive bowel sounds noted in all quadrants.   Musculoskeletal: No joint deformity upper and lower extremities. Good ROM, no contractures. Normal muscle tone.  Neurologic: CN 2-12 grossly intact. Sensation intact.  Patient moving all 4 extremities spontaneously.  Patient is following all commands.  Patient is responsive to verbal stimuli.   Psychiatric: Patient exhibits normal mood with appropriate affect.  Patient seems to possess insight as to their current situation.     Labs on Admission: I have personally reviewed following labs and imaging studies -   CBC: Recent Labs  Lab 01/29/20 2120  WBC 5.8  NEUTROABS 4.8  HGB 15.6  HCT 45.5  MCV 92.3  PLT 215   Basic Metabolic Panel: Recent Labs  Lab 01/29/20 2120  NA 130*  K 3.7  CL 88*  CO2 31  GLUCOSE 117*  BUN 10  CREATININE 0.96  CALCIUM 8.1*   GFR: Estimated Creatinine Clearance: 106 mL/min (by C-G formula based on SCr of 0.96 mg/dL). Liver Function Tests: Recent Labs  Lab 01/29/20 2120  AST 162*  ALT 163*  ALKPHOS 86  BILITOT 0.9  PROT 7.6  ALBUMIN 3.2*   No results for input(s): LIPASE, AMYLASE in the last 168 hours. No results for input(s): AMMONIA in the last 168 hours. Coagulation Profile: No results for input(s): INR, PROTIME in the last 168 hours. Cardiac Enzymes: No results for input(s): CKTOTAL, CKMB, CKMBINDEX, TROPONINI in the last 168 hours. BNP (last 3 results) No results for input(s): PROBNP in the last 8760 hours. HbA1C: No results for input(s): HGBA1C in the last 72 hours. CBG: No results for input(s): GLUCAP in the last 168 hours. Lipid Profile: Recent Labs    01/29/20 2120  TRIG 291*   Thyroid Function Tests: No results for input(s): TSH, T4TOTAL, FREET4, T3FREE, THYROIDAB in the last 72 hours. Anemia Panel: Recent Labs    01/29/20 2120    FERRITIN 5,736*   Urine analysis: No results found for: COLORURINE, APPEARANCEUR, LABSPEC, PHURINE, GLUCOSEU, HGBUR, BILIRUBINUR, KETONESUR, PROTEINUR, UROBILINOGEN, NITRITE, LEUKOCYTESUR  Radiological Exams on Admission - Personally Reviewed: DG Chest Portable 1 View  Result Date: 01/29/2020 CLINICAL DATA:  COVID-19 positive, shortness of breath EXAM: PORTABLE CHEST 1 VIEW COMPARISON:  None. FINDINGS: Single frontal view of the chest demonstrates multifocal airspace disease greatest in the right upper lung zone. No effusion or pneumothorax. Cardiac silhouette is unremarkable. IMPRESSION: 1. Multifocal bilateral pneumonia compatible with COVID 19. Electronically Signed   By: Sharlet Salina M.D.   On: 01/29/2020 21:49    EKG: Personally reviewed.  Rhythm is sinus tachycardia with heart rate of 111 bpm. Left anterior fascicular block. No dynamic ST segment changes appreciated.  Assessment/Plan COVID-19 virus infection   Patient presenting with nearly 10-day history of progressively worsening shortness of breath, cough, poor oral intake and weakness.  Confirmatory COVID-19 PCR testing came back positive while patient was at [facility]. Chest x-ray revealing bilateral patchy infiltrates consistent with COVID-19 infection.  Patient suffering from concurrent acute hypoxic respiratory failure requiring supplemental oxygen via nasal cannula for target  oxygen saturations of 94 to 96%.  Patient has been initiated on intravenous remdesivir and dexamethasone  Providing patient with as needed bronchodilator therapy via MDI  Providing patient with zinc, vitamin C  Procalcitonin unremarkable and therefore there is no evidence of bacterial coinfection.   Active Problems:   Acute respiratory failure with hypoxia (HCC)  Secondary to COVID-19 infection/pneumonia.  Please see assessment and plan above    Dehydration with hyponatremia  Notable volume depletion with associated  hyponatremia secondary to poor oral intake over the past several days due to COVID-19 infection  Hydrating patient with intravenous isotonic fluids  Monitoring sodium levels with serial chemistries.     Code Status:  Full code Family Communication: deferred   Status is: Inpatient  Remains inpatient appropriate because:IV treatments appropriate due to intensity of illness or inability to take PO and Inpatient level of care appropriate due to severity of illness   Dispo: The patient is from: Home              Anticipated d/c is to: Home              Anticipated d/c date is: 3 days              Patient currently is not medically stable to d/c.        Marinda Elk MD [facility] Pager [phone]  If 7PM-7AM, please contact night-coverage [email or web address] Use universal [place] password for that web site. If you do not have the password, please call the hospital operator.  01/30/2020, 4:46 AM

## 2020-01-31 LAB — COMPREHENSIVE METABOLIC PANEL
ALT: 118 U/L — ABNORMAL HIGH (ref 0–44)
AST: 83 U/L — ABNORMAL HIGH (ref 15–41)
Albumin: 2.9 g/dL — ABNORMAL LOW (ref 3.5–5.0)
Alkaline Phosphatase: 80 U/L (ref 38–126)
Anion gap: 10 (ref 5–15)
BUN: 15 mg/dL (ref 6–20)
CO2: 29 mmol/L (ref 22–32)
Calcium: 8.1 mg/dL — ABNORMAL LOW (ref 8.9–10.3)
Chloride: 94 mmol/L — ABNORMAL LOW (ref 98–111)
Creatinine, Ser: 0.77 mg/dL (ref 0.61–1.24)
GFR calc Af Amer: >60 mL/min (ref >60)
GFR calc non Af Amer: >60 mL/min (ref >60)
Glucose, Bld: 185 mg/dL — ABNORMAL HIGH (ref 70–99)
Potassium: 3.7 mmol/L (ref 3.5–5.1)
Sodium: 133 mmol/L — ABNORMAL LOW (ref 135–145)
Total Bilirubin: 0.7 mg/dL (ref 0.3–1.2)
Total Protein: 6.9 g/dL (ref 6.5–8.1)

## 2020-01-31 LAB — C-REACTIVE PROTEIN: CRP: 4.9 mg/dL — ABNORMAL HIGH (ref <1.0)

## 2020-01-31 LAB — D-DIMER, QUANTITATIVE: D-Dimer, Quant: 0.48 ug/mL-FEU (ref 0.00–0.50)

## 2020-01-31 LAB — FERRITIN: Ferritin: 6152 ng/mL — ABNORMAL HIGH (ref 24–336)

## 2020-01-31 MED ORDER — IPRATROPIUM-ALBUTEROL 20-100 MCG/ACT IN AERS
1.0000 | INHALATION_SPRAY | Freq: Four times a day (QID) | RESPIRATORY_TRACT | Status: DC
  Administered 2020-01-31 – 2020-02-02 (×12): 1 via RESPIRATORY_TRACT
  Filled 2020-01-31: qty 4

## 2020-01-31 MED ORDER — ALUM & MAG HYDROXIDE-SIMETH 200-200-20 MG/5ML PO SUSP
30.0000 mL | ORAL | Status: DC | PRN
  Administered 2020-01-31 – 2020-02-01 (×2): 30 mL via ORAL
  Filled 2020-01-31 (×2): qty 30

## 2020-01-31 MED ORDER — PANTOPRAZOLE SODIUM 40 MG PO TBEC
40.0000 mg | DELAYED_RELEASE_TABLET | Freq: Every day | ORAL | Status: DC
  Administered 2020-01-31 – 2020-02-05 (×6): 40 mg via ORAL
  Filled 2020-01-31 (×6): qty 1

## 2020-01-31 NOTE — Progress Notes (Signed)
Prone for over 8 hours last night, was able to maintain his 02 at   88 % to 91% round midnight 02 was increased to 6l [place] due to increase work of breathing and feeling anxious O2 reminds greater than 88 %, report given to [employer] who will resume care.

## 2020-01-31 NOTE — Progress Notes (Signed)
PROGRESS NOTE    HINTON LUELLEN  GUR:[id number] DOB: [birth year] DOA: 01/29/2020 PCP: Pcp, No    Brief Narrative:  Patient admitted to the hospital working diagnosis of acute hypoxic respiratory failure due to SARS COVID-19 viral pneumonia.  49 year old male with no significant past medical history, transferred [facility] due to acute hypoxic respiratory failure.  Patient was diagnosed with COVID-19 September 15, positive sick contacts at home.  Patient developed progressive dyspnea to the point where he became dyspneic with minimal efforts.  Persistent dry cough, generalized malaise, loss of taste and smell and poor oral intake.  He was seen at [facility] where he was found hypoxic, requiring 3 L of submental oxygen, blood pressure 110/85, heart rate 92, respiratory 25-29, temperature 98.6, oxygen saturation 92% on supplemental oxygen, lungs with Rales at bases bilaterally, no wheezing, heart S1-S2, present rhythmic, soft abdomen, no lower extremity edema. Sodium 130, potassium 3.7, chloride 88, bicarb 31, glucose 117, BUN 10, creatinine 0.96, white count 5.8, hemoglobin 15.6, hematocrit 45.5, platelets 215.  SARS COVID-19 positive. Chest radiograph with bilateral interstitial infiltrates, predominantly right upper lobe / left lower lobe and faint infiltrate at the right lower lobe.  Patient with high inflammatory markers and bilateral multilobar interstitial infiltrates, placed on baricitinib.    Assessment & Plan:   Principal Problem:   Pneumonia due to COVID-19 virus Active Problems:   Acute respiratory failure with hypoxia (HCC)   Dehydration with hyponatremia   1. Acute hypoxic respiratory failure due to SARS COVID 19 viral pneumonia.   RR: 22 Pulse oxymetry: 92%  Fi02: 6L/min per [place]   COVID-19 Labs  Recent Labs    01/29/20 2120 01/30/20 0513 01/31/20 0531  DDIMER 0.86* 0.78* 0.48  FERRITIN 5,736*  --  6,152*  LDH 577*  --   --   CRP 9.5* 11.0*  4.9*    Lab Results  Component Value Date   SARSCOV2NAA POSITIVE (A) 01/29/2020   SARSCOV2NAA Not Detected 03/31/2019   Patient not yet back to baseline, continue to have fatigue and dyspnea on exertion. Had increased oxygen requirements to 6 L from 3 L. Inflammatory markers are trending down but continue to be elevated.   Continue medical therapy with remdesivir #3/5, systemic steroids and baricitinib. Bronchodilators, antitussive agents and airway clearing techniques. Encourage out of bed and prone position as tolerated.      2. Hyponatremia, dehydration and metabolic alkalosis. Patient now off IV fluids, renal function with serum cr at 0,77 with K at 3,7 and serum bicarbonate at 29.  Continue close follow up on renal function and electrolytes. Patient is tolerating po well.     Patient continue to be at high risk for worsening hypoxemia.   Status is: Inpatient  Remains inpatient appropriate because:IV treatments appropriate due to intensity of illness or inability to take PO   Dispo: The patient is from: Home              Anticipated d/c is to: Home              Anticipated d/c date is: 3 days              Patient currently is not medically stable to d/c.   DVT prophylaxis: Enoxaparin   Code Status:   full  Family Communication:  No family at the bedside      Subjective: Patient with persistent dyspnea and decreased energy, no nausea or vomiting, no chest pain.  Objective: Vitals:   01/30/20 1250 01/30/20 1530 01/30/20 1946 01/31/20 0521  BP: 118/83 129/88 138/74 (!) 137/93  Pulse: 94 92 100 84  Resp: (!) 23 (!) 22 (!) 25 (!) 22  Temp: 99.2 F (37.3 C) 98.8 F (37.1 C) 97.8 F (36.6 C) 97.9 F (36.6 C)  TempSrc: Oral Oral    SpO2: (!) 86% (!) 83% 90% 92%  Weight:      Height:        Intake/Output Summary (Last 24 hours) at 01/31/2020 1015 Last data filed at 01/30/2020 1820 Gross per 24 hour  Intake 360 ml  Output 400 ml  Net -40 ml   Filed Weights     01/29/20 1929  Weight: 95.3 kg    Examination:   General: Not in pain or dyspnea., deconditioned  Neurology: Awake and alert, non focal  E ENT: mild pallor, no icterus, oral mucosa moist Cardiovascular: No JVD. S1-S2 present, rhythmic, no gallops, rubs, or murmurs. No lower extremity edema. Pulmonary: positive breath sounds bilaterally,with, no wheezing. Gastrointestinal. Abdomen soft and non tender Skin. No rashes Musculoskeletal: no joint deformities     Data Reviewed: I have personally reviewed following labs and imaging studies  CBC: Recent Labs  Lab 01/29/20 2120 01/30/20 0513  WBC 5.8 5.5  NEUTROABS 4.8 4.8  HGB 15.6 15.3  HCT 45.5 44.3  MCV 92.3 92.7  PLT 215 222   Basic Metabolic Panel: Recent Labs  Lab 01/29/20 2120 01/30/20 0513 01/31/20 0531  NA 130* 131* 133*  K 3.7 4.0 3.7  CL 88* 92* 94*  CO2 31 28 29   GLUCOSE 117* 166* 185*  BUN 10 11 15   CREATININE 0.96 0.73 0.77  CALCIUM 8.1* 8.2* 8.1*  MG  --  2.8*  --    GFR: Estimated Creatinine Clearance: 127.2 mL/min (by C-G formula based on SCr of 0.77 mg/dL). Liver Function Tests: Recent Labs  Lab 01/29/20 2120 01/30/20 0513 01/31/20 0531  AST 162* 131* 83*  ALT 163* 145* 118*  ALKPHOS 86 85 80  BILITOT 0.9 0.7 0.7  PROT 7.6 7.1 6.9  ALBUMIN 3.2* 3.1* 2.9*   No results for input(s): LIPASE, AMYLASE in the last 168 hours. No results for input(s): AMMONIA in the last 168 hours. Coagulation Profile: No results for input(s): INR, PROTIME in the last 168 hours. Cardiac Enzymes: No results for input(s): CKTOTAL, CKMB, CKMBINDEX, TROPONINI in the last 168 hours. BNP (last 3 results) No results for input(s): PROBNP in the last 8760 hours. HbA1C: No results for input(s): HGBA1C in the last 72 hours. CBG: No results for input(s): GLUCAP in the last 168 hours. Lipid Profile: Recent Labs    01/29/20 2120  TRIG 291*   Thyroid Function Tests: No results for input(s): TSH, T4TOTAL, FREET4,  T3FREE, THYROIDAB in the last 72 hours. Anemia Panel: Recent Labs    01/29/20 2120 01/31/20 0531  FERRITIN 5,736* 6,152*      Radiology Studies: I have reviewed all of the imaging during this hospital visit personally     Scheduled Meds:  vitamin C  500 mg Oral Daily   baricitinib  4 mg Oral Daily   enoxaparin (LOVENOX) injection  40 mg Subcutaneous Q24H   Ipratropium-Albuterol  1 puff Inhalation QID   methylPREDNISolone (SOLU-MEDROL) injection  50 mg Intravenous Q12H   zinc sulfate  220 mg Oral Daily   Continuous Infusions:  remdesivir 100 mg in NS 100 mL 100 mg (01/30/20 1100)     LOS: 1 day  Catelynn Sparger Gerome Apley, MD

## 2020-02-01 LAB — COMPREHENSIVE METABOLIC PANEL
ALT: 91 U/L — ABNORMAL HIGH (ref 0–44)
AST: 50 U/L — ABNORMAL HIGH (ref 15–41)
Albumin: 2.8 g/dL — ABNORMAL LOW (ref 3.5–5.0)
Alkaline Phosphatase: 68 U/L (ref 38–126)
Anion gap: 8 (ref 5–15)
BUN: 15 mg/dL (ref 6–20)
CO2: 30 mmol/L (ref 22–32)
Calcium: 7.9 mg/dL — ABNORMAL LOW (ref 8.9–10.3)
Chloride: 96 mmol/L — ABNORMAL LOW (ref 98–111)
Creatinine, Ser: 0.66 mg/dL (ref 0.61–1.24)
GFR calc Af Amer: >60 mL/min (ref >60)
GFR calc non Af Amer: >60 mL/min (ref >60)
Glucose, Bld: 159 mg/dL — ABNORMAL HIGH (ref 70–99)
Potassium: 3.8 mmol/L (ref 3.5–5.1)
Sodium: 134 mmol/L — ABNORMAL LOW (ref 135–145)
Total Bilirubin: 0.6 mg/dL (ref 0.3–1.2)
Total Protein: 6.5 g/dL (ref 6.5–8.1)

## 2020-02-01 LAB — FERRITIN: Ferritin: 4090 ng/mL — ABNORMAL HIGH (ref 24–336)

## 2020-02-01 LAB — D-DIMER, QUANTITATIVE: D-Dimer, Quant: 0.41 ug/mL-FEU (ref 0.00–0.50)

## 2020-02-01 LAB — C-REACTIVE PROTEIN: CRP: 1.1 mg/dL — ABNORMAL HIGH (ref <1.0)

## 2020-02-01 NOTE — Progress Notes (Addendum)
PROGRESS NOTE    Seth Lee  KXF:[id number] DOB: 1970-05-15 DOA: 01/29/2020 PCP: Pcp, No    Brief Narrative:  Patient admitted to the hospital working diagnosis of acute hypoxic respiratory failure due to SARS COVID-19 viral pneumonia.  49 year old male with no significant past medical history[facility]. Patient was diagnosed with COVID-19 September 15, positive sick contacts at home. Patient developed progressive dyspnea to the point where he became dyspneic with minimal efforts. Persistent dry cough, generalized malaise, loss of taste and smell and poor oral intake. He was seen at [facility] where he was found hypoxic, requiring 3 L of submental oxygen, blood pressure 110/85, heart rate 92, respiratory 25-29, temperature 98.6, oxygen saturation 92% on supplemental oxygen, lungs with Rales at bases bilaterally, no wheezing, heart S1-S2, present rhythmic, soft abdomen, no lower extremity edema. Sodium 130, potassium 3.7, chloride 88, bicarb 31, glucose 117, BUN 10, creatinine 0.96, white count 5.8, hemoglobin 15.6, hematocrit 45.5, platelets 215.SARS COVID-19 positive. Chest radiograph with bilateral interstitial infiltrates, predominantly right upper lobe/left lower lobeandfaint infiltrate at the right lower lobe.  Patient with high inflammatory markers and bilateral multilobar interstitial infiltrates, placed on baricitinib.    Assessment & Plan:   Principal Problem:   Pneumonia due to COVID-19 virus Active Problems:   Acute respiratory failure with hypoxia (HCC)   Dehydration with hyponatremia   1. Acute hypoxic respiratory failure due to SARS COVID 19 viral pneumonia.  RR: 18  Pulse oxymetry: 90%  Fi02: 5 L/min per [place]   COVID-19 Labs  Recent Labs    01/29/20 2120 01/29/20 2120 01/30/20 0513 01/31/20 0531 02/01/20 0433  DDIMER 0.86*   < > 0.78* 0.48 0.41  FERRITIN 5,736*  --    --  6,152* 4,090*  LDH 577*  --   --   --   --   CRP 9.5*   < > 11.0* 4.9* 1.1*   < > = values in this interval not displayed.    Lab Results  Component Value Date   SARSCOV2NAA POSITIVE (A) 01/29/2020   SARSCOV2NAA Not Detected 03/31/2019    Inflammatory markers are trending down and symptoms are improving, but not yet back to baseline. Decreased oxygen requirements to 5 L/min with good toleration.  Medical therapy with remdesivir #4/5, systemic steroids and baricitinib. Contineu with bronchodilators, antitussive agents and airway clearing techniques.  Out of bed to chair tid with meals and prone position as tolerated.     Physical and occupational therapy evaluation.   2. Hyponatremia, dehydration and metabolic alkalosis. Renal function stable with serum cr at 0,66 with K at 3,8 and bicarbonate at 30. Follow up on renal function in am. Low appetite, but no nausea or vomiting.     Status is: Inpatient  Remains inpatient appropriate because:IV treatments appropriate due to intensity of illness or inability to take PO   Dispo: The patient is from: Home              Anticipated d/c is to: Home              Anticipated d/c date is: 3 days              Patient currently is not medically stable to d/c.   DVT prophylaxis: Enoxaparin   Code Status:   full  Family Communication:  No family at the bedside      Subjective: Patient continue to feel weak and with  poor appetite, no nausea or vomiting, dyspnea slowly improving but not yet back to baseline,.   Objective: Vitals:   01/31/20 2110 01/31/20 2223 02/01/20 0431 02/01/20 0618  BP: 135/72   115/79  Pulse: 84   80  Resp: 18   18  Temp: (!) 97.5 F (36.4 C)   97.7 F (36.5 C)  TempSrc: Oral   Oral  SpO2: (!) 88% 91% 90% 90%  Weight:      Height:        Intake/Output Summary (Last 24 hours) at 02/01/2020 1001 Last data filed at 02/01/2020 0600 Gross per 24 hour  Intake 360 ml  Output 2175 ml  Net -1815 ml    Filed Weights   01/29/20 1929  Weight: 95.3 kg    Examination:   General: Not in pain or dyspnea,. Deconditioned  Neurology: Awake and alert, non focal  E ENT: mild pallor, no icterus, oral mucosa moist Cardiovascular: No JVD. S1-S2 present, rhythmic, no gallops, rubs, or murmurs. No lower extremity edema. Pulmonary: positive breath sounds bilaterally no wheezing or rhonchi  Gastrointestinal. Abdomen soft and non tender.  Skin. No rashes Musculoskeletal: no joint deformities     Data Reviewed: I have personally reviewed following labs and imaging studies  CBC: Recent Labs  Lab 01/29/20 2120 01/30/20 0513  WBC 5.8 5.5  NEUTROABS 4.8 4.8  HGB 15.6 15.3  HCT 45.5 44.3  MCV 92.3 92.7  PLT 215 222   Basic Metabolic Panel: Recent Labs  Lab 01/29/20 2120 01/30/20 0513 01/31/20 0531 02/01/20 0433  NA 130* 131* 133* 134*  K 3.7 4.0 3.7 3.8  CL 88* 92* 94* 96*  CO2 31 28 29 30   GLUCOSE 117* 166* 185* 159*  BUN 10 11 15 15   CREATININE 0.96 0.73 0.77 0.66  CALCIUM 8.1* 8.2* 8.1* 7.9*  MG  --  2.8*  --   --    GFR: Estimated Creatinine Clearance: 127.2 mL/min (by C-G formula based on SCr of 0.66 mg/dL). Liver Function Tests: Recent Labs  Lab 01/29/20 2120 01/30/20 0513 01/31/20 0531 02/01/20 0433  AST 162* 131* 83* 50*  ALT 163* 145* 118* 91*  ALKPHOS 86 85 80 68  BILITOT 0.9 0.7 0.7 0.6  PROT 7.6 7.1 6.9 6.5  ALBUMIN 3.2* 3.1* 2.9* 2.8*   No results for input(s): LIPASE, AMYLASE in the last 168 hours. No results for input(s): AMMONIA in the last 168 hours. Coagulation Profile: No results for input(s): INR, PROTIME in the last 168 hours. Cardiac Enzymes: No results for input(s): CKTOTAL, CKMB, CKMBINDEX, TROPONINI in the last 168 hours. BNP (last 3 results) No results for input(s): PROBNP in the last 8760 hours. HbA1C: No results for input(s): HGBA1C in the last 72 hours. CBG: No results for input(s): GLUCAP in the last 168 hours. Lipid  Profile: Recent Labs    01/29/20 2120  TRIG 291*   Thyroid Function Tests: No results for input(s): TSH, T4TOTAL, FREET4, T3FREE, THYROIDAB in the last 72 hours. Anemia Panel: Recent Labs    01/31/20 0531 02/01/20 0433  FERRITIN 6,152* 4,090*      Radiology Studies: I have reviewed all of the imaging during this hospital visit personally     Scheduled Meds: . vitamin C  500 mg Oral Daily  . baricitinib  4 mg Oral Daily  . enoxaparin (LOVENOX) injection  40 mg Subcutaneous Q24H  . Ipratropium-Albuterol  1 puff Inhalation QID  . methylPREDNISolone (SOLU-MEDROL) injection  50 mg Intravenous Q12H  . pantoprazole  40 mg Oral Daily  . zinc sulfate  220 mg Oral Daily   Continuous Infusions: . remdesivir 100 mg in NS 100 mL 100 mg (02/01/20 0855)     LOS: 2 days        Audine Mangione Annett Gula, MD

## 2020-02-01 NOTE — Progress Notes (Signed)
This shift pt was able to lay prone from 2200 with no complications. Maintained O2 sats  From 88- low 90's on 5L. Current reading 90. Will continue to monitor

## 2020-02-02 LAB — COMPREHENSIVE METABOLIC PANEL
ALT: 81 U/L — ABNORMAL HIGH (ref 0–44)
AST: 43 U/L — ABNORMAL HIGH (ref 15–41)
Albumin: 3 g/dL — ABNORMAL LOW (ref 3.5–5.0)
Alkaline Phosphatase: 67 U/L (ref 38–126)
Anion gap: 8 (ref 5–15)
BUN: 15 mg/dL (ref 6–20)
CO2: 27 mmol/L (ref 22–32)
Calcium: 8.2 mg/dL — ABNORMAL LOW (ref 8.9–10.3)
Chloride: 99 mmol/L (ref 98–111)
Creatinine, Ser: 0.63 mg/dL (ref 0.61–1.24)
GFR calc Af Amer: >60 mL/min (ref >60)
GFR calc non Af Amer: >60 mL/min (ref >60)
Glucose, Bld: 155 mg/dL — ABNORMAL HIGH (ref 70–99)
Potassium: 4.2 mmol/L (ref 3.5–5.1)
Sodium: 134 mmol/L — ABNORMAL LOW (ref 135–145)
Total Bilirubin: 0.4 mg/dL (ref 0.3–1.2)
Total Protein: 6.5 g/dL (ref 6.5–8.1)

## 2020-02-02 LAB — FERRITIN: Ferritin: 3322 ng/mL — ABNORMAL HIGH (ref 24–336)

## 2020-02-02 LAB — C-REACTIVE PROTEIN: CRP: 0.6 mg/dL (ref <1.0)

## 2020-02-02 LAB — D-DIMER, QUANTITATIVE: D-Dimer, Quant: 0.62 ug/mL-FEU — ABNORMAL HIGH (ref 0.00–0.50)

## 2020-02-02 MED ORDER — METHYLPREDNISOLONE SODIUM SUCC 125 MG IJ SOLR
50.0000 mg | Freq: Every day | INTRAMUSCULAR | Status: DC
  Administered 2020-02-03 – 2020-02-05 (×3): 50 mg via INTRAVENOUS
  Filled 2020-02-02 (×3): qty 2

## 2020-02-02 NOTE — Evaluation (Signed)
Physical Therapy Evaluation Patient Details Name: Seth Lee MRN: [id number] DOB: 04/08/1971 Today's Date: 02/02/2020   History of Present Illness  Patient is a 49 year old male no significant past medical history, transferred from [facility] due to acute hypoxic respiratory failure, diagnosed with COVID 9/15  Clinical Impression  Pt admitted with above diagnosis. Pt ambulated 37' without an assistive device, no loss of balance. SaO2 90% on 6L while walking, then dropped to 84% on 6L O2 during seated rest immediately after walking. After 8 minutes seated rest, SaO2 was 85% on 6L O2. Instructed pt in LE strengthening exercises, pursed lip breathing, and in use of incentive spirometer. Respiratory status limits activity tolerance. Pt currently with functional limitations due to the deficits listed below (see PT Problem List). Pt will benefit from skilled PT to increase their independence and safety with mobility to allow discharge to the venue listed below.       Follow Up Recommendations No PT follow up    Equipment Recommendations  None recommended by PT    Recommendations for Other Services       Precautions / Restrictions Precautions Precautions: None Precaution Comments: monitor O2 saturations Restrictions Weight Bearing Restrictions: No      Mobility  Bed Mobility Overal bed mobility: Modified Independent             General bed mobility comments: up in recliner  Transfers Overall transfer level: Needs assistance Equipment used: None Transfers: Sit to/from Stand;Stand Pivot Transfers Sit to Stand: Supervision Stand pivot transfers: Supervision       General transfer comment: patient supervision for safety due to decreased activity tolerance, mild unsteadiness noted  Ambulation/Gait Ambulation/Gait assistance: Min guard Gait Distance (Feet): 80 Feet Assistive device: None Gait Pattern/deviations: Decreased stride length Gait velocity: decr    General Gait Details: 90% on 6L O2 while walking, then dropped to 84% on 6L O2 during seated rest after walking, after 8 minutes seated rest SaO2 was 85% on 6 L (RN notified), distance limited by fatigue, pt had poor awareness of deficits, he stated he was "fine" but had 4/4 dyspnea, face was turning bright red and RR was increased while walking; encouraged pursed lip breathing and use of incentive spirometer  Stairs            Wheelchair Mobility    Modified Rankin (Stroke Patients Only)       Balance Overall balance assessment: Mild deficits observed, not formally tested                                           Pertinent Vitals/Pain Pain Assessment: No/denies pain    Home Living Family/patient expects to be discharged to:: Private residence Living Arrangements: Alone Available Help at Discharge: Family;Friend(s);Available PRN/intermittently Type of Home: House Home Access: Level entry     Home Layout: One level Home Equipment: None      Prior Function Level of Independence: Independent         Comments: works from home, likes to fish     Hand Dominance   Dominant Hand: Right    Extremity/Trunk Assessment   Upper Extremity Assessment Upper Extremity Assessment: Defer to OT evaluation    Lower Extremity Assessment Lower Extremity Assessment: Overall WFL for tasks assessed    Cervical / Trunk Assessment Cervical / Trunk Assessment: Normal  Communication   Communication: No  difficulties  Cognition Arousal/Alertness: Awake/alert Behavior During Therapy: WFL for tasks assessed/performed Overall Cognitive Status: Within Functional Limits for tasks assessed                                        General Comments General comments (skin integrity, edema, etc.): patient desat to 82% on 5L with standing sink side, max cues for pursed lip breathing during activity and with recovery due to shallow mouth breathing. patient  recover to 85-87% on 5L in chair with approx 1-2 min rest. RN aware.    Exercises General Exercises - Lower Extremity Ankle Circles/Pumps: AROM;Both;10 reps;Seated Long Arc Quad: AROM;Both;5 reps;Seated Hip Flexion/Marching: AROM;Both;5 reps;Seated Other Exercises Other Exercises: educate patient in seated exercises ankle pumps, seated marches and leg kicks, use of IS and PLB    Assessment/Plan    PT Assessment Patient needs continued PT services  PT Problem List Decreased mobility;Decreased activity tolerance;Pain       PT Treatment Interventions Gait training;Functional mobility training;Therapeutic exercise;Therapeutic activities;Patient/family education    PT Goals (Current goals can be found in the Care Plan section)  Acute Rehab PT Goals Patient Stated Goal: fishing, yardwork PT Goal Formulation: With patient Time For Goal Achievement: 02/16/20 Potential to Achieve Goals: Good    Frequency Min 3X/week   Barriers to discharge        Co-evaluation               AM-PAC PT "6 Clicks" Mobility  Outcome Measure Help needed turning from your back to your side while in a flat bed without using bedrails?: None Help needed moving from lying on your back to sitting on the side of a flat bed without using bedrails?: None Help needed moving to and from a bed to a chair (including a wheelchair)?: None Help needed standing up from a chair using your arms (e.g., wheelchair or bedside chair)?: None Help needed to walk in hospital room?: None Help needed climbing 3-5 steps with a railing? : A Little 6 Click Score: 23    End of Session Equipment Utilized During Treatment: Gait belt;Oxygen Activity Tolerance: Patient limited by fatigue;Treatment limited secondary to medical complications (Comment) (hypoxia) Patient left: in chair;with call bell/phone within reach Nurse Communication: Mobility status;Other (comment) (walking and resting O2 sats) PT Visit Diagnosis: Difficulty  in walking, not elsewhere classified (R26.2)    Time: 0630-1601 PT Time Calculation (min) (ACUTE ONLY): 27 min   Charges:   PT Evaluation $PT Eval Low Complexity: 1 Low PT Treatments $Gait Training: 8-22 mins       Ralene Bathe Kistler PT 02/02/2020  Acute Rehabilitation Services Pager [phone] Office [phone]

## 2020-02-02 NOTE — Progress Notes (Addendum)
PROGRESS NOTE    Seth Lee  TKP:[id number] DOB: 09-10-1970 DOA: 01/29/2020 PCP: Pcp, No    Brief Narrative:  Patient admitted to the hospital working diagnosis of acute hypoxic respiratory failure due to SARS COVID-19 viral pneumonia.  49 year old male with no significant past medical history, [facility]. Patient was diagnosed with COVID-19 September 15, positive sick contacts at home. Patient developed progressive dyspnea to the point where he became dyspneic with minimal efforts. Persistent dry cough, generalized malaise, loss of taste and smell and poor oral intake. He was seen at [facility] where he was found hypoxic, requiring 3 L of submental oxygen, blood pressure 110/85, heart rate 92, respiratory 25-29, temperature 98.6, oxygen saturation 92% on supplemental oxygen, lungs with Rales at bases bilaterally, no wheezing, heart S1-S2, present rhythmic, soft abdomen, no lower extremity edema. Sodium 130, potassium 3.7, chloride 88, bicarb 31, glucose 117, BUN 10, creatinine 0.96, white count 5.8, hemoglobin 15.6, hematocrit 45.5, platelets 215.SARS COVID-19 positive. Chest radiograph with bilateral interstitial infiltrates, predominantly right upper lobe/left lower lobeandfaint infiltrate at the right lower lobe.  Patient withhigh inflammatory markers and bilateral multilobar interstitial infiltrates, placed on baricitinib.   Assessment & Plan:   Principal Problem:   Pneumonia due to COVID-19 virus Active Problems:   Acute respiratory failure with hypoxia (HCC)   Dehydration with hyponatremia   1. Acute hypoxic respiratory failure due to SARS COVID 19 viral pneumonia.  RR: 19  Pulse oxymetry: 87% to 93%  Fi02: 5 L/min per [place]   COVID-19 Labs  Recent Labs    01/31/20 0531 02/01/20 0433 02/02/20 0358  DDIMER 0.48 0.41 0.62*  FERRITIN 6,152* 4,090* 3,322*  CRP 4.9* 1.1* 0.6     Lab Results  Component Value Date   SARSCOV2NAA POSITIVE (A) 01/29/2020   SARSCOV2NAA Not Detected 03/31/2019    Inflammatory markers are trending down, patient continue to have oxygen desaturation with movement.   Patient will complete Remdesivir #5/5 today. Continue with systemic steroids (decrease dose to once daily) and baricitinib. On bronchodilators, antitussive agents and airway clearing techniques.  Continue to encourage out of bed to chair tid with meals and prone position as tolerated.Continue PT and OT recommendations.   Patient likely will need 48 H more of hospitalization, for further improvement in oxygenation.   2. Hyponatremia, dehydration and metabolic alkalosis.Na at 134, K at 4,2 with bicarbonate at 27 and renal function with cr at 0,63. Continue close follow up on renal function and electrolytes, patient is tolerating po well.     Status is: Inpatient  Remains inpatient appropriate because:IV treatments appropriate due to intensity of illness or inability to take PO   Dispo: The patient is from: Home              Anticipated d/c is to: Home              Anticipated d/c date is: 2 days              Patient currently is not medically stable to d/c.   DVT prophylaxis: Enoxaparin   Code Status:    full  Family Communication:  No family at the bedside      Subjective: Patient with dyspnea on exertion, no nausea or vomiting, no chest pain, slowly getting better but not yet back to baseline,   Objective: Vitals:   02/01/20 0618 02/01/20 1342 02/01/20 1937 02/02/20 0425  BP: 115/79 123/70 116/77 125/71  Pulse: 80 91 76 77  Resp: 18 19 19 19   Temp: 97.7 F (36.5 C) 97.7 F (36.5 C) (!) 96.8 F (36 C) (!) 96.8 F (36 C)  TempSrc: Oral Oral Axillary Axillary  SpO2: 90% 91% 93% (!) 87%  Weight:      Height:        Intake/Output Summary (Last 24 hours) at 02/02/2020 1020 Last data filed at 02/02/2020 02/04/2020 Gross per 24 hour  Intake 440 ml   Output 1825 ml  Net -1385 ml   Filed Weights   01/29/20 1929  Weight: 95.3 kg    Examination:   General: Not in pain or dyspnea, deconditioned  Neurology: Awake and alert, non focal  E ENT: mild pallor, no icterus, oral mucosa moist Cardiovascular: No JVD. S1-S2 present, rhythmic, no gallops, rubs, or murmurs. No lower extremity edema. Pulmonary: positive breath sounds bilaterally, with no wheezing, rhonchi or rales. Gastrointestinal. Abdomen soft and non tender Skin. No rashes Musculoskeletal: no joint deformities     Data Reviewed: I have personally reviewed following labs and imaging studies  CBC: Recent Labs  Lab 01/29/20 2120 01/30/20 0513  WBC 5.8 5.5  NEUTROABS 4.8 4.8  HGB 15.6 15.3  HCT 45.5 44.3  MCV 92.3 92.7  PLT 215 222   Basic Metabolic Panel: Recent Labs  Lab 01/29/20 2120 01/30/20 0513 01/31/20 0531 02/01/20 0433 02/02/20 0358  NA 130* 131* 133* 134* 134*  K 3.7 4.0 3.7 3.8 4.2  CL 88* 92* 94* 96* 99  CO2 31 28 29 30 27   GLUCOSE 117* 166* 185* 159* 155*  BUN 10 11 15 15 15   CREATININE 0.96 0.73 0.77 0.66 0.63  CALCIUM 8.1* 8.2* 8.1* 7.9* 8.2*  MG  --  2.8*  --   --   --    GFR: Estimated Creatinine Clearance: 127.2 mL/min (by C-G formula based on SCr of 0.63 mg/dL). Liver Function Tests: Recent Labs  Lab 01/29/20 2120 01/30/20 0513 01/31/20 0531 02/01/20 0433 02/02/20 0358  AST 162* 131* 83* 50* 43*  ALT 163* 145* 118* 91* 81*  ALKPHOS 86 85 80 68 67  BILITOT 0.9 0.7 0.7 0.6 0.4  PROT 7.6 7.1 6.9 6.5 6.5  ALBUMIN 3.2* 3.1* 2.9* 2.8* 3.0*   No results for input(s): LIPASE, AMYLASE in the last 168 hours. No results for input(s): AMMONIA in the last 168 hours. Coagulation Profile: No results for input(s): INR, PROTIME in the last 168 hours. Cardiac Enzymes: No results for input(s): CKTOTAL, CKMB, CKMBINDEX, TROPONINI in the last 168 hours. BNP (last 3 results) No results for input(s): PROBNP in the last 8760  hours. HbA1C: No results for input(s): HGBA1C in the last 72 hours. CBG: No results for input(s): GLUCAP in the last 168 hours. Lipid Profile: No results for input(s): CHOL, HDL, LDLCALC, TRIG, CHOLHDL, LDLDIRECT in the last 72 hours. Thyroid Function Tests: No results for input(s): TSH, T4TOTAL, FREET4, T3FREE, THYROIDAB in the last 72 hours. Anemia Panel: Recent Labs    02/01/20 0433 02/02/20 0358  FERRITIN 4,090* 3,322*      Radiology Studies: I have reviewed all of the imaging during this hospital visit personally     Scheduled Meds: . vitamin C  500 mg Oral Daily  . baricitinib  4 mg Oral Daily  . enoxaparin (LOVENOX) injection  40 mg Subcutaneous Q24H  . Ipratropium-Albuterol  1 puff Inhalation QID  . methylPREDNISolone (SOLU-MEDROL) injection  50 mg Intravenous Q12H  . pantoprazole  40 mg Oral Daily  .  zinc sulfate  220 mg Oral Daily   Continuous Infusions: . remdesivir 100 mg in NS 100 mL 100 mg (02/02/20 0959)     LOS: 3 days        Seth Lee Annett Gula, MD

## 2020-02-02 NOTE — Progress Notes (Signed)
Occupational Therapy Evaluation  Patient with functional deficits listed below impacting safety and independence with self care. Patient supervision level for functional ambulation to sink and transfer to recliner due to mild unsteadiness, decreased activity tolerance and desat to low 80s on 5L. Patient heavily mouth breathing requiring max cues for pursed lip breathing techniques, recovers to 85-87% on 5L with RN aware. Recommend continued acute OT services for further education on breathing techniques, energy conservation and to maximize activity tolerance in order to facilitate D/C to venue listed below.    02/02/20 1456  OT Visit Information  Last OT Received On 02/02/20  Assistance Needed +1  History of Present Illness Patient is a 49 year old male no significant past medical history, transferred from [facility] due to acute hypoxic respiratory failure, diagnosed with COVID 9/15  Precautions  Precautions None  Precaution Comments monitor saturations  Restrictions  Weight Bearing Restrictions No  Home Living  Family/patient expects to be discharged to: Private residence  Living Arrangements Alone  Available Help at Discharge Family;Friend(s);Available PRN/intermittently  Type of Home House  Home Access Level entry  Home Layout One level  Bathroom Shower/Tub Tub/shower unit  Tour manager None  Prior Function  Level of Independence Independent  Comments works from home, likes to fish  Communication  Communication No difficulties  Pain Assessment  Pain Assessment No/denies pain  Cognition  Arousal/Alertness Awake/alert  Behavior During Therapy WFL for tasks assessed/performed  Overall Cognitive Status Within Functional Limits for tasks assessed  Upper Extremity Assessment  Upper Extremity Assessment Generalized weakness  Lower Extremity Assessment  Lower Extremity Assessment Defer to PT evaluation  ADL  Overall ADL's  Needs  assistance/impaired  Grooming Oral care;Supervision/safety;Standing  Grooming Details (indicate cue type and reason) cues for pursed lip breathing techniques due to shallow mouth breathing  Upper Body Bathing Set up;Sitting  Lower Body Bathing Supervison/ safety;Sit to/from stand  Upper Body Dressing  Set up;Sitting  Lower Body Dressing Supervision/safety;Sit to/from stand  Lower Body Dressing Details (indicate cue type and reason) patient able to doff underwear and don clean pair at [employer] Transfer Supervision/safety;Stand-pivot  Toilet Transfer Details (indicate cue type and reason) to recliner, supervision for safety due to decreased activity tolerance  Toileting- Clothing Manipulation and Hygiene Supervision/safety;Sit to/from stand  Functional mobility during ADLs Supervision/safety  Bed Mobility  Overal bed mobility Modified Independent  Transfers  Overall transfer level Needs assistance  Equipment used None  Transfers Sit to/from Stand;Stand Pivot Transfers  Sit to Stand Supervision  Stand pivot transfers Supervision  General transfer comment patient supervision for safety due to decreased activity tolerance, mild unsteadiness noted  Balance  Overall balance assessment Mild deficits observed, not formally tested  General Comments  General comments (skin integrity, edema, etc.) patient desat to 82% on 5L with standing sink side, max cues for pursed lip breathing during activity and with recovery due to shallow mouth breathing. patient recover to 85-87% on 5L in chair with approx 1-2 min rest. RN aware.  Exercises  Exercises Other exercises  Other Exercises  Other Exercises educate patient in seated exercises ankle pumps, seated marches and leg kicks, use of IS and PLB   OT - End of Session  Equipment Utilized During Treatment Oxygen  Activity Tolerance Patient tolerated treatment well  Patient left in chair;with call bell/phone within reach  Nurse Communication Other  (comment) (saturations)  OT Assessment  OT Recommendation/Assessment Patient needs continued OT Services  OT Visit  Diagnosis Muscle weakness (generalized) (M62.81)  OT Problem List Decreased activity tolerance;Cardiopulmonary status limiting activity;Decreased strength  OT Plan  OT Frequency (ACUTE ONLY) Min 2X/week  OT Treatment/Interventions (ACUTE ONLY) Therapeutic exercise;Energy conservation;DME and/or AE instruction;Patient/family education;Therapeutic activities  AM-PAC OT "6 Clicks" Daily Activity Outcome Measure (Version 2)  Help from another person eating meals? 4  Help from another person taking care of personal grooming? 3  Help from another person toileting, which includes using toliet, bedpan, or urinal? 3  Help from another person bathing (including washing, rinsing, drying)? 3  Help from another person to put on and taking off regular upper body clothing? 3  Help from another person to put on and taking off regular lower body clothing? 3  6 Click Score 19  OT Recommendation  Follow Up Recommendations No OT follow up  OT Equipment None recommended by OT  Individuals Consulted  Consulted and Agree with Results and Recommendations Patient  Acute Rehab OT Goals  Patient Stated Goal get better  OT Goal Formulation With patient  Time For Goal Achievement 02/16/20  Potential to Achieve Goals Good  OT Time Calculation  OT Start Time (ACUTE ONLY) 1047  OT Stop Time (ACUTE ONLY) 1106  OT Time Calculation (min) 19 min  OT General Charges  $OT Visit 1 Visit  OT Evaluation  $OT Eval Low Complexity 1 Low  Written Expression  Dominant Hand Right   Marlyce Huge OT OT pager: [phone]

## 2020-02-03 LAB — COMPREHENSIVE METABOLIC PANEL
ALT: 77 U/L — ABNORMAL HIGH (ref 0–44)
AST: 36 U/L (ref 15–41)
Albumin: 3 g/dL — ABNORMAL LOW (ref 3.5–5.0)
Alkaline Phosphatase: 70 U/L (ref 38–126)
Anion gap: 10 (ref 5–15)
BUN: 17 mg/dL (ref 6–20)
CO2: 26 mmol/L (ref 22–32)
Calcium: 8.3 mg/dL — ABNORMAL LOW (ref 8.9–10.3)
Chloride: 101 mmol/L (ref 98–111)
Creatinine, Ser: 0.7 mg/dL (ref 0.61–1.24)
GFR calc Af Amer: >60 mL/min (ref >60)
GFR calc non Af Amer: >60 mL/min (ref >60)
Glucose, Bld: 123 mg/dL — ABNORMAL HIGH (ref 70–99)
Potassium: 4 mmol/L (ref 3.5–5.1)
Sodium: 137 mmol/L (ref 135–145)
Total Bilirubin: 0.6 mg/dL (ref 0.3–1.2)
Total Protein: 6.5 g/dL (ref 6.5–8.1)

## 2020-02-03 LAB — CBC WITH DIFFERENTIAL/PLATELET
Abs Immature Granulocytes: 0.24 10*3/uL — ABNORMAL HIGH (ref 0.00–0.07)
Basophils Absolute: 0 10*3/uL (ref 0.0–0.1)
Basophils Relative: 0 %
Eosinophils Absolute: 0 10*3/uL (ref 0.0–0.5)
Eosinophils Relative: 0 %
HCT: 44.6 % (ref 39.0–52.0)
Hemoglobin: 15.2 g/dL (ref 13.0–17.0)
Immature Granulocytes: 2 %
Lymphocytes Relative: 12 %
Lymphs Abs: 1.4 10*3/uL (ref 0.7–4.0)
MCH: 31.7 pg (ref 26.0–34.0)
MCHC: 34.1 g/dL (ref 30.0–36.0)
MCV: 93.1 fL (ref 80.0–100.0)
Monocytes Absolute: 1 10*3/uL (ref 0.1–1.0)
Monocytes Relative: 9 %
Neutro Abs: 9.1 10*3/uL — ABNORMAL HIGH (ref 1.7–7.7)
Neutrophils Relative %: 77 %
Platelets: 458 10*3/uL — ABNORMAL HIGH (ref 150–400)
RBC: 4.79 MIL/uL (ref 4.22–5.81)
RDW: 12.7 % (ref 11.5–15.5)
WBC: 11.8 10*3/uL — ABNORMAL HIGH (ref 4.0–10.5)
nRBC: 0.2 % (ref 0.0–0.2)

## 2020-02-03 LAB — LACTATE DEHYDROGENASE: LDH: 377 U/L — ABNORMAL HIGH (ref 98–192)

## 2020-02-03 LAB — MAGNESIUM: Magnesium: 2.3 mg/dL (ref 1.7–2.4)

## 2020-02-03 LAB — FERRITIN: Ferritin: 1726 ng/mL — ABNORMAL HIGH (ref 24–336)

## 2020-02-03 LAB — PHOSPHORUS: Phosphorus: 2 mg/dL — ABNORMAL LOW (ref 2.5–4.6)

## 2020-02-03 LAB — C-REACTIVE PROTEIN: CRP: 0.8 mg/dL (ref <1.0)

## 2020-02-03 LAB — D-DIMER, QUANTITATIVE: D-Dimer, Quant: 0.81 ug/mL-FEU — ABNORMAL HIGH (ref 0.00–0.50)

## 2020-02-03 MED ORDER — IPRATROPIUM-ALBUTEROL 20-100 MCG/ACT IN AERS
1.0000 | INHALATION_SPRAY | Freq: Four times a day (QID) | RESPIRATORY_TRACT | Status: DC
  Administered 2020-02-03 – 2020-02-05 (×6): 1 via RESPIRATORY_TRACT
  Filled 2020-02-03: qty 4

## 2020-02-03 MED ORDER — IPRATROPIUM-ALBUTEROL 20-100 MCG/ACT IN AERS
1.0000 | INHALATION_SPRAY | Freq: Two times a day (BID) | RESPIRATORY_TRACT | Status: DC
  Administered 2020-02-03: 1 via RESPIRATORY_TRACT

## 2020-02-03 NOTE — Progress Notes (Signed)
Physical Therapy Treatment Patient Details Name: Seth Lee MRN: [id number] DOB: [birth year] Today's Date: 02/03/2020    History of Present Illness Patient is a 49 year old male no significant past medical history, transferred from [facility] due to acute hypoxic respiratory failure, diagnosed with COVID 9/15    PT Comments    Pt reports he's feeling worse today compared to yesterday. Pt transferred from the bed to recliner, SaO2 dropped to 81% on 6L O2 with transfer. After seated rest, pt performed sit to stand x 4 repetitions, tolerance limited by 4/4 dyspnea. Pt performed seated LE exercises. Decreased activity tolerance today.    Follow Up Recommendations  Home health PT     Equipment Recommendations  None recommended by PT    Recommendations for Other Services       Precautions / Restrictions Precautions Precautions: None Precaution Comments: monitor O2 saturations Restrictions Weight Bearing Restrictions: No    Mobility  Bed Mobility Overal bed mobility: Modified Independent             General bed mobility comments: increased time and effort  Transfers Overall transfer level: Needs assistance Equipment used: None Transfers: Sit to/from Stand;Stand Pivot Transfers Sit to Stand: Supervision Stand pivot transfers: Supervision       General transfer comment: patient supervision for safety due to decreased activity tolerance, mild unsteadiness noted. SPT from bed to recliner, SaO2 81% on 6L o2 with activity. After seated rest, pt performed sit to stand x 4 reps, tolerance limited by 4/4 dyspnea and hypoxia.  Ambulation/Gait             General Gait Details: deferred 2* poor tolerance of transfers   Stairs             Wheelchair Mobility    Modified Rankin (Stroke Patients Only)       Balance Overall balance assessment: Mild deficits observed, not formally tested                                           Cognition Arousal/Alertness: Awake/alert Behavior During Therapy: WFL for tasks assessed/performed Overall Cognitive Status: Within Functional Limits for tasks assessed                                        Exercises General Exercises - Lower Extremity Long Arc Quad: AROM;Both;Seated;10 reps Hip Flexion/Marching: AROM;Both;5 reps;Seated    General Comments        Pertinent Vitals/Pain Pain Assessment: No/denies pain    Home Living                      Prior Function            PT Goals (current goals can now be found in the care plan section) Acute Rehab PT Goals Patient Stated Goal: fishing, yardwork PT Goal Formulation: With patient Time For Goal Achievement: 02/16/20 Potential to Achieve Goals: Good Progress towards PT goals: Progressing toward goals    Frequency    Min 3X/week      PT Plan Current plan remains appropriate    Co-evaluation              AM-PAC PT "6 Clicks" Mobility   Outcome Measure  Help needed turning from your back to  your side while in a flat bed without using bedrails?: None Help needed moving from lying on your back to sitting on the side of a flat bed without using bedrails?: None Help needed moving to and from a bed to a chair (including a wheelchair)?: None Help needed standing up from a chair using your arms (e.g., wheelchair or bedside chair)?: None Help needed to walk in hospital room?: A Lot Help needed climbing 3-5 steps with a railing? : A Lot 6 Click Score: 20    End of Session Equipment Utilized During Treatment: Gait belt;Oxygen Activity Tolerance: Patient limited by fatigue;Treatment limited secondary to medical complications (Comment) (hypoxia) Patient left: in chair;with call bell/phone within reach Nurse Communication: Mobility status;Other (comment) (hypoxia with activity) PT Visit Diagnosis: Difficulty in walking, not elsewhere classified (R26.2)     Time: 6314-9702 PT Time  Calculation (min) (ACUTE ONLY): 14 min  Charges:  $Therapeutic Activity: 8-22 mins                     Ralene Bathe Kistler PT 02/03/2020  Acute Rehabilitation Services Pager [phone] Office [phone]

## 2020-02-03 NOTE — Progress Notes (Signed)
PROGRESS NOTE    Seth Lee  PXT:[id number] DOB: 25-Sep-1970 DOA: 01/29/2020 PCP: Pcp, No     Brief Narrative:  Patient admitted to the hospital working diagnosis of acute hypoxic respiratory failure due to SARS COVID-19 viral pneumonia.  49 year old WM PMHx no significant history.   [facility]. Patient was diagnosed with COVID-19 September 15, positive sick contacts at home. Patient developed progressive dyspnea to the point where he became dyspneic with minimal efforts. Persistent dry cough, generalized malaise, loss of taste and smell and poor oral intake. He was seen at [facility] where he was found hypoxic, requiring 3 L of submental oxygen, blood pressure 110/85, heart rate 92, respiratory 25-29, temperature 98.6, oxygen saturation 92% on supplemental oxygen, lungs with Rales at bases bilaterally, no wheezing, heart S1-S2, present rhythmic, soft abdomen, no lower extremity edema. Sodium 130, potassium 3.7, chloride 88, bicarb 31, glucose 117, BUN 10, creatinine 0.96, white count 5.8, hemoglobin 15.6, hematocrit 45.5, platelets 215.SARS COVID-19 positive. Chest radiograph with bilateral interstitial infiltrates, predominantly right upper lobe/left lower lobeandfaint infiltrate at the right lower lobe.  Patient withhigh inflammatory markers and bilateral multilobar interstitial infiltrates, placed on baricitinib.   Subjective: Afebrile overnight   Assessment & Plan: Covid vaccination; no vaccine   Principal Problem:   Pneumonia due to COVID-19 virus Active Problems:   Acute respiratory failure with hypoxia (HCC)   Dehydration with hyponatremia   Acute respiratory failure with hypoxia/Covid pneumonia COVID-19 Labs  Recent Labs    02/01/20 0433 02/02/20 0358 02/03/20 0339 02/03/20 0914  DDIMER 0.41 0.62* 0.81*  --   FERRITIN 4,090* 3,322* 1,726*  --   LDH  --   --   --  377*    CRP 1.1* 0.6 0.8  --     Lab Results  Component Value Date   SARSCOV2NAA POSITIVE (A) 01/29/2020   SARSCOV2NAA Not Detected 03/31/2019  -Baricitinib 4 mg x 10 days per pharmacy protocol -Solu-Medrol 50 mg daily -Remdesivir per pharmacy; completed course -Vitamin C plus zinc per Covid protocol -Respimat QID -Flutter valve -Incentive spirometer -Titrate O2 to maintain SPO2> 88% -Prone patient for 8 hours today; if patient cannot tolerate prone 2 to 3 hours per shift  Hyponatremia  -Resolved     DVT prophylaxis: Lovenox Code Status: Full Family Communication:  Status is: Inpatient    Dispo: The patient is from: Home              Anticipated d/c is to: Home              Anticipated d/c date is: 10/2              Patient currently unstable      Consultants:    Procedures/Significant Events:    I have personally reviewed and interpreted all radiology studies and my findings are as above.  VENTILATOR SETTINGS: Nasal Canula 9/29 Flow; 6 L/min SPO2; 90%   Cultures   Antimicrobials: Anti-infectives (From admission, onward)   Start     Dose/Rate Route Frequency Ordered Stop   01/30/20 1000  remdesivir 100 mg in sodium chloride 0.9 % 100 mL IVPB       "Followed by" Linked Group Details   100 mg 200 mL/hr over 30 Minutes Intravenous Daily 01/29/20 2129 02/02/20 1029   01/29/20 2200  remdesivir 100 mg in sodium chloride 0.9 % 100 mL IVPB       "Followed by" Linked Group  Details   100 mg 200 mL/hr over 30 Minutes Intravenous  Once 01/29/20 2129 01/30/20 0025   01/29/20 2130  remdesivir 100 mg in sodium chloride 0.9 % 100 mL IVPB       "Followed by" Linked Group Details   100 mg 200 mL/hr over 30 Minutes Intravenous  Once 01/29/20 2129 01/29/20 2344       Devices    LINES / TUBES:      Continuous Infusions:   Objective: Vitals:   02/02/20 1317 02/02/20 1326 02/02/20 2028 02/03/20 0354  BP: 132/88 135/89 131/85 (!) 152/91  Pulse: 95 92 74  84  Resp:  18 18 18   Temp:  98 F (36.7 C) 98.2 F (36.8 C) 98 F (36.7 C)  TempSrc:  Oral Oral Oral  SpO2: (!) 87% 90% 97% 96%  Weight:      Height:        Intake/Output Summary (Last 24 hours) at 02/03/2020 02/05/2020 Last data filed at 02/03/2020 0359 Gross per 24 hour  Intake 700 ml  Output 1900 ml  Net -1200 ml   Filed Weights   01/29/20 1929  Weight: 95.3 kg    Examination:  General: A/O x4, positive acute respiratory distress Eyes: negative scleral hemorrhage, negative anisocoria, negative icterus ENT: Negative Runny nose, negative gingival bleeding, Neck:  Negative scars, masses, torticollis, lymphadenopathy, JVD Lungs: decreased breath sounds bilaterally without wheezes or crackles Cardiovascular: Regular rate and rhythm without murmur gallop or rub normal S1 and S2 Abdomen: negative abdominal pain, nondistended, positive soft, bowel sounds, no rebound, no ascites, no appreciable mass Extremities: No significant cyanosis, clubbing, or edema bilateral lower extremities Skin: Negative rashes, lesions, ulcers Psychiatric:  Negative depression, negative anxiety, negative fatigue, negative mania  Central nervous system:  Cranial nerves II through XII intact, tongue/uvula midline, all extremities muscle strength 5/5, sensation intact throughout, negative dysarthria, negative expressive aphasia, negative receptive aphasia.  .     Data Reviewed: Care during the described time interval was provided by me .  I have reviewed this patient's available data, including medical history, events of note, physical examination, and all test results as part of my evaluation.  CBC: Recent Labs  Lab 01/29/20 2120 01/30/20 0513  WBC 5.8 5.5  NEUTROABS 4.8 4.8  HGB 15.6 15.3  HCT 45.5 44.3  MCV 92.3 92.7  PLT 215 222   Basic Metabolic Panel: Recent Labs  Lab 01/30/20 0513 01/31/20 0531 02/01/20 0433 02/02/20 0358 02/03/20 0339  NA 131* 133* 134* 134* 137  K 4.0 3.7 3.8 4.2  4.0  CL 92* 94* 96* 99 101  CO2 28 29 30 27 26   GLUCOSE 166* 185* 159* 155* 123*  BUN 11 15 15 15 17   CREATININE 0.73 0.77 0.66 0.63 0.70  CALCIUM 8.2* 8.1* 7.9* 8.2* 8.3*  MG 2.8*  --   --   --   --    GFR: Estimated Creatinine Clearance: 127.2 mL/min (by C-G formula based on SCr of 0.7 mg/dL). Liver Function Tests: Recent Labs  Lab 01/30/20 0513 01/31/20 0531 02/01/20 0433 02/02/20 0358 02/03/20 0339  AST 131* 83* 50* 43* 36  ALT 145* 118* 91* 81* 77*  ALKPHOS 85 80 68 67 70  BILITOT 0.7 0.7 0.6 0.4 0.6  PROT 7.1 6.9 6.5 6.5 6.5  ALBUMIN 3.1* 2.9* 2.8* 3.0* 3.0*   No results for input(s): LIPASE, AMYLASE in the last 168 hours. No results for input(s): AMMONIA in the last 168 hours. Coagulation Profile: No results for  input(s): INR, PROTIME in the last 168 hours. Cardiac Enzymes: No results for input(s): CKTOTAL, CKMB, CKMBINDEX, TROPONINI in the last 168 hours. BNP (last 3 results) No results for input(s): PROBNP in the last 8760 hours. HbA1C: No results for input(s): HGBA1C in the last 72 hours. CBG: No results for input(s): GLUCAP in the last 168 hours. Lipid Profile: No results for input(s): CHOL, HDL, LDLCALC, TRIG, CHOLHDL, LDLDIRECT in the last 72 hours. Thyroid Function Tests: No results for input(s): TSH, T4TOTAL, FREET4, T3FREE, THYROIDAB in the last 72 hours. Anemia Panel: Recent Labs    02/02/20 0358 02/03/20 0339  FERRITIN 3,322* 1,726*   Sepsis Labs: Recent Labs  Lab 01/29/20 2120 01/30/20 0513  PROCALCITON 0.33  --   LATICACIDVEN 1.7 1.1    Recent Results (from the past 240 hour(s))  Blood Culture (routine x 2)     Status: None (Preliminary result)   Collection Time: 01/29/20  9:19 PM   Specimen: Left Antecubital; Blood  Result Value Ref Range Status   Specimen Description   Final    LEFT ANTECUBITAL Performed at [facility], [address] [place], [place] [address]    Special Requests   Final    BOTTLES DRAWN AEROBIC  AND ANAEROBIC Blood Culture adequate volume Performed at [facility], [address] [place], [place] [address]    Culture   Final    NO GROWTH 4 DAYS Performed at [facility], [address] [place], [place] [address]    Report Status PENDING  Incomplete  Blood Culture (routine x 2)     Status: None (Preliminary result)   Collection Time: 01/29/20  9:37 PM   Specimen: Right Antecubital; Blood  Result Value Ref Range Status   Specimen Description   Final    RIGHT ANTECUBITAL Performed at [facility] [place], [address] [place], [place] [address]    Special Requests   Final    BOTTLES DRAWN AEROBIC AND ANAEROBIC Blood Culture adequate volume Performed at [facility] [place], [address] [place], [place] [address]    Culture   Final    NO GROWTH 4 DAYS Performed at [facility], [address] [place], [place] [address]    Report Status PENDING  Incomplete  SARS Coronavirus 2 by RT PCR (hospital order, performed in [facility] lab) Nasopharyngeal Nasopharyngeal Swab     Status: Abnormal   Collection Time: 01/29/20 11:11 PM   Specimen: Nasopharyngeal Swab  Result Value Ref Range Status   SARS Coronavirus 2 POSITIVE (A) NEGATIVE Final    Comment: RESULT CALLED TO, READ BACK BY AND VERIFIED WITH: WALTON,M AT 0010 ON [id number] BY CHERESNOWSKY,T (NOTE) SARS-CoV-2 target nucleic acids are DETECTED  SARS-CoV-2 RNA is generally detectable in upper respiratory specimens  during the acute phase of infection.  Positive results are indicative  of the presence of the identified virus, but do not rule out bacterial infection or co-infection with other pathogens not detected by the test.  Clinical correlation with patient history and  other diagnostic information is necessary to determine patient infection status.  The expected result is negative.  Fact Sheet for Patients:   BoilerBrush.com.cyhttps://www.fda.gov/media/136312/download   Fact Sheet for  Healthcare Providers:   https://pope.com/https://www.fda.gov/media/136313/download    This test is not yet approved or cleared by the [place] FDA and  has been authorized for detection and/or diagnosis of SARS-CoV-2 by FDA under an Emergency Use  Authorization (EUA).  This EUA will remain in effect (meani ng this test can be used) for the duration of  the COVID-19 declaration under Section 564(b)(1) of the Act, 21 U.S.C. section 360-bbb-3(b)(1), unless the authorization is terminated or revoked sooner.  Performed at [facility], [address], [place], [place] [address]          Radiology Studies: No results found.      Scheduled Meds: . vitamin C  500 mg Oral Daily  . baricitinib  4 mg Oral Daily  . enoxaparin (LOVENOX) injection  40 mg Subcutaneous Q24H  . Ipratropium-Albuterol  1 puff Inhalation BID  . methylPREDNISolone (SOLU-MEDROL) injection  50 mg Intravenous Daily  . pantoprazole  40 mg Oral Daily  . zinc sulfate  220 mg Oral Daily   Continuous Infusions:   LOS: 4 days    Time spent:40 min    Gustavia Carie, Roselind Messier, MD [facility] Pager [phone]  If 7PM-7AM, please contact night-coverage [email or web address] Password [facility] 02/03/2020, 8:28 AM

## 2020-02-04 LAB — COMPREHENSIVE METABOLIC PANEL
ALT: 64 U/L — ABNORMAL HIGH (ref 0–44)
AST: 30 U/L (ref 15–41)
Albumin: 2.9 g/dL — ABNORMAL LOW (ref 3.5–5.0)
Alkaline Phosphatase: 64 U/L (ref 38–126)
Anion gap: 11 (ref 5–15)
BUN: 15 mg/dL (ref 6–20)
CO2: 23 mmol/L (ref 22–32)
Calcium: 8.4 mg/dL — ABNORMAL LOW (ref 8.9–10.3)
Chloride: 102 mmol/L (ref 98–111)
Creatinine, Ser: 0.83 mg/dL (ref 0.61–1.24)
GFR calc Af Amer: >60 mL/min (ref >60)
GFR calc non Af Amer: >60 mL/min (ref >60)
Glucose, Bld: 93 mg/dL (ref 70–99)
Potassium: 3.9 mmol/L (ref 3.5–5.1)
Sodium: 136 mmol/L (ref 135–145)
Total Bilirubin: 0.8 mg/dL (ref 0.3–1.2)
Total Protein: 6.3 g/dL — ABNORMAL LOW (ref 6.5–8.1)

## 2020-02-04 LAB — CBC WITH DIFFERENTIAL/PLATELET
Abs Immature Granulocytes: 0.46 10*3/uL — ABNORMAL HIGH (ref 0.00–0.07)
Basophils Absolute: 0.1 10*3/uL (ref 0.0–0.1)
Basophils Relative: 1 %
Eosinophils Absolute: 0 10*3/uL (ref 0.0–0.5)
Eosinophils Relative: 0 %
HCT: 45.8 % (ref 39.0–52.0)
Hemoglobin: 15.2 g/dL (ref 13.0–17.0)
Immature Granulocytes: 4 %
Lymphocytes Relative: 13 %
Lymphs Abs: 1.4 10*3/uL (ref 0.7–4.0)
MCH: 31.7 pg (ref 26.0–34.0)
MCHC: 33.2 g/dL (ref 30.0–36.0)
MCV: 95.4 fL (ref 80.0–100.0)
Monocytes Absolute: 1.1 10*3/uL — ABNORMAL HIGH (ref 0.1–1.0)
Monocytes Relative: 10 %
Neutro Abs: 7.8 10*3/uL — ABNORMAL HIGH (ref 1.7–7.7)
Neutrophils Relative %: 72 %
Platelets: 470 10*3/uL — ABNORMAL HIGH (ref 150–400)
RBC: 4.8 MIL/uL (ref 4.22–5.81)
RDW: 13 % (ref 11.5–15.5)
WBC: 10.9 10*3/uL — ABNORMAL HIGH (ref 4.0–10.5)
nRBC: 0 % (ref 0.0–0.2)

## 2020-02-04 LAB — LACTATE DEHYDROGENASE: LDH: 342 U/L — ABNORMAL HIGH (ref 98–192)

## 2020-02-04 LAB — CULTURE, BLOOD (ROUTINE X 2)
Culture: NO GROWTH
Culture: NO GROWTH
Special Requests: ADEQUATE
Special Requests: ADEQUATE

## 2020-02-04 LAB — PHOSPHORUS: Phosphorus: 2.6 mg/dL (ref 2.5–4.6)

## 2020-02-04 LAB — C-REACTIVE PROTEIN: CRP: 3.8 mg/dL — ABNORMAL HIGH (ref <1.0)

## 2020-02-04 LAB — D-DIMER, QUANTITATIVE: D-Dimer, Quant: 0.79 ug/mL-FEU — ABNORMAL HIGH (ref 0.00–0.50)

## 2020-02-04 LAB — MAGNESIUM: Magnesium: 2.5 mg/dL — ABNORMAL HIGH (ref 1.7–2.4)

## 2020-02-04 LAB — FERRITIN: Ferritin: 2370 ng/mL — ABNORMAL HIGH (ref 24–336)

## 2020-02-04 NOTE — Progress Notes (Signed)
PROGRESS NOTE    Seth Lee  HER:[id number] DOB: 08-27-1970 DOA: 01/29/2020 PCP: Pcp, No     Brief Narrative:  Patient admitted to the hospital working diagnosis of acute hypoxic respiratory failure due to SARS COVID-19 viral pneumonia.  49 year old WM PMHx no significant history.   [facility]. Patient was diagnosed with COVID-19 September 15, positive sick contacts at home. Patient developed progressive dyspnea to the point where he became dyspneic with minimal efforts. Persistent dry cough, generalized malaise, loss of taste and smell and poor oral intake. He was seen at [facility] where he was found hypoxic, requiring 3 L of submental oxygen, blood pressure 110/85, heart rate 92, respiratory 25-29, temperature 98.6, oxygen saturation 92% on supplemental oxygen, lungs with Rales at bases bilaterally, no wheezing, heart S1-S2, present rhythmic, soft abdomen, no lower extremity edema. Sodium 130, potassium 3.7, chloride 88, bicarb 31, glucose 117, BUN 10, creatinine 0.96, white count 5.8, hemoglobin 15.6, hematocrit 45.5, platelets 215.SARS COVID-19 positive. Chest radiograph with bilateral interstitial infiltrates, predominantly right upper lobe/left lower lobeandfaint infiltrate at the right lower lobe.  Patient withhigh inflammatory markers and bilateral multilobar interstitial infiltrates, placed on baricitinib.   Subjective: 9/30 Afebrile overnight   Afebrile overnight   Assessment & Plan: Covid vaccination; no vaccine   Principal Problem:   Pneumonia due to COVID-19 virus Active Problems:   Acute respiratory failure with hypoxia (HCC)   Dehydration with hyponatremia   Acute respiratory failure with hypoxia/Covid pneumonia COVID-19 Labs  Recent Labs    02/02/20 0358 02/03/20 0339 02/03/20 0914 02/04/20 0433  DDIMER 0.62* 0.81*  --  0.79*  FERRITIN 3,322* 1,726*  --  2,370*   LDH  --   --  377* 342*  CRP 0.6 0.8  --  3.8*    Lab Results  Component Value Date   SARSCOV2NAA POSITIVE (A) 01/29/2020   SARSCOV2NAA Not Detected 03/31/2019  -Baricitinib 4 mg x 10 days per pharmacy protocol -Solu-Medrol 50 mg daily -Remdesivir per pharmacy; completed course -Vitamin C plus zinc per Covid protocol -Respimat QID -Flutter valve -Incentive spirometer -Titrate O2 to maintain SPO2> 88% -Prone patient for 8 hours today; if patient cannot tolerate prone 2 to 3 hours per shift -9/30 out of bed to chair every shift: Ambulate patient   Hyponatremia  -Resolved     DVT prophylaxis: Lovenox Code Status: Full Family Communication:  Status is: Inpatient    Dispo: The patient is from: Home              Anticipated d/c is to: Home              Anticipated d/c date is: 10/2              Patient currently unstable      Consultants:    Procedures/Significant Events:    I have personally reviewed and interpreted all radiology studies and my findings are as above.  VENTILATOR SETTINGS: Nasal Canula 9/30 Flow; 6 L/min SPO2; 90%   Cultures 9/24 SARS coronavirus positive    Antimicrobials: Anti-infectives (From admission, onward)   Start     Stop   01/30/20 1000  remdesivir 100 mg in sodium chloride 0.9 % 100 mL IVPB       "Followed by" Linked Group Details   02/02/20 1029   01/29/20 2200  remdesivir 100 mg in sodium chloride 0.9 % 100 mL IVPB       "Followed  by" Linked Group Details   01/30/20 0025   01/29/20 2130  remdesivir 100 mg in sodium chloride 0.9 % 100 mL IVPB       "Followed by" Linked Group Details   01/29/20 2344       Devices    LINES / TUBES:      Continuous Infusions:   Objective: Vitals:   02/03/20 1349 02/03/20 1505 02/03/20 1955 02/04/20 0343  BP: 117/78  134/90 (!) 143/87  Pulse: 91  76 (!) 102  Resp: 16  18 18   Temp: 98.1 F (36.7 C)  98.1 F (36.7 C) 98.5 F (36.9 C)  TempSrc: Oral  Oral Axillary   SpO2: 94% (!) 81% 96% 90%  Weight:      Height:        Intake/Output Summary (Last 24 hours) at 02/04/2020 1312 Last data filed at 02/04/2020 02/06/2020 Gross per 24 hour  Intake 472 ml  Output 1050 ml  Net -578 ml   Filed Weights   01/29/20 1929  Weight: 95.3 kg    Examination:  General: A/O x4, positive acute respiratory distress Eyes: negative scleral hemorrhage, negative anisocoria, negative icterus ENT: Negative Runny nose, negative gingival bleeding, Neck:  Negative scars, masses, torticollis, lymphadenopathy, JVD Lungs: decreased breath sounds bilaterally without wheezes or crackles Cardiovascular: Regular rate and rhythm without murmur gallop or rub normal S1 and S2 Abdomen: negative abdominal pain, nondistended, positive soft, bowel sounds, no rebound, no ascites, no appreciable mass Extremities: No significant cyanosis, clubbing, or edema bilateral lower extremities Skin: Negative rashes, lesions, ulcers Psychiatric:  Negative depression, negative anxiety, negative fatigue, negative mania  Central nervous system:  Cranial nerves II through XII intact, tongue/uvula midline, all extremities muscle strength 5/5, sensation intact throughout, negative dysarthria, negative expressive aphasia, negative receptive aphasia.  .     Data Reviewed: Care during the described time interval was provided by me .  I have reviewed this patient's available data, including medical history, events of note, physical examination, and all test results as part of my evaluation.  CBC: Recent Labs  Lab 01/29/20 2120 01/30/20 0513 02/03/20 0914 02/04/20 0433  WBC 5.8 5.5 11.8* 10.9*  NEUTROABS 4.8 4.8 9.1* 7.8*  HGB 15.6 15.3 15.2 15.2  HCT 45.5 44.3 44.6 45.8  MCV 92.3 92.7 93.1 95.4  PLT 215 222 458* 470*   Basic Metabolic Panel: Recent Labs  Lab 01/30/20 0513 01/30/20 0513 01/31/20 0531 02/01/20 0433 02/02/20 0358 02/03/20 0339 02/03/20 0914 02/04/20 0433  NA 131*   < > 133*  134* 134* 137  --  136  K 4.0   < > 3.7 3.8 4.2 4.0  --  3.9  CL 92*   < > 94* 96* 99 101  --  102  CO2 28   < > 29 30 27 26   --  23  GLUCOSE 166*   < > 185* 159* 155* 123*  --  93  BUN 11   < > 15 15 15 17   --  15  CREATININE 0.73   < > 0.77 0.66 0.63 0.70  --  0.83  CALCIUM 8.2*   < > 8.1* 7.9* 8.2* 8.3*  --  8.4*  MG 2.8*  --   --   --   --   --  2.3 2.5*  PHOS  --   --   --   --   --   --  2.0* 2.6   < > = values in this interval not displayed.  GFR: Estimated Creatinine Clearance: 122.6 mL/min (by C-G formula based on SCr of 0.83 mg/dL). Liver Function Tests: Recent Labs  Lab 01/31/20 0531 02/01/20 0433 02/02/20 0358 02/03/20 0339 02/04/20 0433  AST 83* 50* 43* 36 30  ALT 118* 91* 81* 77* 64*  ALKPHOS 80 68 67 70 64  BILITOT 0.7 0.6 0.4 0.6 0.8  PROT 6.9 6.5 6.5 6.5 6.3*  ALBUMIN 2.9* 2.8* 3.0* 3.0* 2.9*   No results for input(s): LIPASE, AMYLASE in the last 168 hours. No results for input(s): AMMONIA in the last 168 hours. Coagulation Profile: No results for input(s): INR, PROTIME in the last 168 hours. Cardiac Enzymes: No results for input(s): CKTOTAL, CKMB, CKMBINDEX, TROPONINI in the last 168 hours. BNP (last 3 results) No results for input(s): PROBNP in the last 8760 hours. HbA1C: No results for input(s): HGBA1C in the last 72 hours. CBG: No results for input(s): GLUCAP in the last 168 hours. Lipid Profile: No results for input(s): CHOL, HDL, LDLCALC, TRIG, CHOLHDL, LDLDIRECT in the last 72 hours. Thyroid Function Tests: No results for input(s): TSH, T4TOTAL, FREET4, T3FREE, THYROIDAB in the last 72 hours. Anemia Panel: Recent Labs    02/03/20 0339 02/04/20 0433  FERRITIN 1,726* 2,370*   Sepsis Labs: Recent Labs  Lab 01/29/20 2120 01/30/20 0513  PROCALCITON 0.33  --   LATICACIDVEN 1.7 1.1    Recent Results (from the past 240 hour(s))  Blood Culture (routine x 2)     Status: None   Collection Time: 01/29/20  9:19 PM   Specimen: Left  Antecubital; Blood  Result Value Ref Range Status   Specimen Description   Final    LEFT ANTECUBITAL Performed at [facility] [place] [facility], [address] [place], [place] [address]    Special Requests   Final    BOTTLES DRAWN AEROBIC AND ANAEROBIC Blood Culture adequate volume Performed at [facility] [place], [address] [place], [place] [address]    Culture   Final    NO GROWTH 5 DAYS Performed at [facility], [address] [place], [place] [address]    Report Status 02/04/2020 FINAL  Final  Blood Culture (routine x 2)     Status: None   Collection Time: 01/29/20  9:37 PM   Specimen: Right Antecubital; Blood  Result Value Ref Range Status   Specimen Description   Final    RIGHT ANTECUBITAL Performed at [facility] [place], [address] [place], [place] [address]    Special Requests   Final    BOTTLES DRAWN AEROBIC AND ANAEROBIC Blood Culture adequate volume Performed at [facility] [place], [address] [place], [place] [address]    Culture   Final    NO GROWTH 5 DAYS Performed at [facility], [address] [place], [place] [address]    Report Status 02/04/2020 FINAL  Final  SARS Coronavirus 2 by RT PCR (hospital order, performed in [facility] lab) Nasopharyngeal Nasopharyngeal Swab     Status: Abnormal   Collection Time: 01/29/20 11:11 PM   Specimen: Nasopharyngeal Swab  Result Value Ref Range Status   SARS Coronavirus 2 POSITIVE (A) NEGATIVE Final    Comment: RESULT CALLED TO, READ BACK BY AND VERIFIED WITH: WALTON,M AT 0010 ON [id number] BY CHERESNOWSKY,T (NOTE) SARS-CoV-2 target nucleic acids are DETECTED  SARS-CoV-2 RNA is generally detectable in upper respiratory specimens  during the acute phase of infection.  Positive results are indicative  of the presence of the identified virus, but do not rule out bacterial infection or co-infection with other pathogens not detected by the test.  Clinical correlation with  patient history and  other diagnostic information is necessary to determine patient infection status.  The expected result is negative.  Fact Sheet for Patients:   BoilerBrush.com.cy   Fact Sheet for Healthcare Providers:   https://pope.com/    This test is not yet approved or cleared by the [place] FDA and  has been authorized for detection and/or diagnosis of SARS-CoV-2 by FDA under an Emergency Use Authorization (EUA).  This EUA will remain in effect (meani ng this test can be used) for the duration of  the COVID-19 declaration under Section 564(b)(1) of the Act, 21 U.S.C. section 360-bbb-3(b)(1), unless the authorization is terminated or revoked sooner.  Performed at [facility], [address] [place], [place] [address]          Radiology Studies: No results found.      Scheduled Meds: . vitamin C  500 mg Oral Daily  . baricitinib  4 mg Oral Daily  . enoxaparin (LOVENOX) injection  40 mg Subcutaneous Q24H  . Ipratropium-Albuterol  1 puff Inhalation Q6H  . methylPREDNISolone (SOLU-MEDROL) injection  50 mg Intravenous Daily  . pantoprazole  40 mg Oral Daily  . zinc sulfate  220 mg Oral Daily   Continuous Infusions:   LOS: 5 days    Time spent:40 min    Haylen Bellotti, Roselind Messier, MD [facility] Pager [phone]  If 7PM-7AM, please contact night-coverage [email or web address] Password [email or web address] 02/04/2020, 1:12 PM

## 2020-02-04 NOTE — Progress Notes (Signed)
Physical Therapy Treatment Patient Details Name: Seth Lee MRN: [id number] DOB: 1970/12/12 Today's Date: 02/04/2020    History of Present Illness Patient is a 49 year old male no significant past medical history, transferred from [facility] due to acute hypoxic respiratory failure, diagnosed with COVID 9/15    PT Comments    The patient  Resting on 5 L [place] at 955. Placed on 4 L to ambulate x 100' then 200'. SPO2 remained >865 with recovery to 88?%. Patient left on 4 L, RN notified. SPo2 staying at 88%. Patient's dyspnea 2/5 while ambulating.   Follow Up Recommendations  Home health PT     Equipment Recommendations  None recommended by PT    Recommendations for Other Services       Precautions / Restrictions Precautions Precautions: None Precaution Comments: monitor O2 saturations    Mobility  Bed Mobility               General bed mobility comments: in reclinr  Transfers   Equipment used: None Transfers: Sit to/from Stand Sit to Stand: Independent            Ambulation/Gait Ambulation/Gait assistance: Supervision Gait Distance (Feet): 100 Feet (then 200') Assistive device: None Gait Pattern/deviations: Decreased stride length Gait velocity: decr   General Gait Details: improved cadence, moves slowly   Stairs             Wheelchair Mobility    Modified Rankin (Stroke Patients Only)       Balance Overall balance assessment: No apparent balance deficits (not formally assessed)                                          Cognition Arousal/Alertness: Awake/alert Behavior During Therapy: WFL for tasks assessed/performed                                          Exercises Other Exercises Other Exercises: educate patient in seated exercises ankle pumps, seated marches and leg kicks, use of IS and PLB  Other Exercises: blue TB for UE    General Comments        Pertinent Vitals/Pain       Home Living                      Prior Function            PT Goals (current goals can now be found in the care plan section) Progress towards PT goals: Progressing toward goals    Frequency    Min 3X/week      PT Plan Current plan remains appropriate    Co-evaluation              AM-PAC PT "6 Clicks" Mobility   Outcome Measure  Help needed turning from your back to your side while in a flat bed without using bedrails?: None Help needed moving from lying on your back to sitting on the side of a flat bed without using bedrails?: None Help needed moving to and from a bed to a chair (including a wheelchair)?: None Help needed standing up from a chair using your arms (e.g., wheelchair or bedside chair)?: None Help needed to walk in hospital room?: A Little Help needed climbing 3-5 steps  with a railing? : A Little 6 Click Score: 22    End of Session Equipment Utilized During Treatment: Oxygen Activity Tolerance: Patient tolerated treatment well Patient left: in chair;with call bell/phone within reach Nurse Communication: Mobility status;Other (comment) (left on 4 l [place]) PT Visit Diagnosis: Difficulty in walking, not elsewhere classified (R26.2)     Time: 2706-2376 PT Time Calculation (min) (ACUTE ONLY): 27 min  Charges:  $Gait Training: 23-37 mins                     Seth Lee PT Acute Rehabilitation Services Pager [phone] Office [phone]    Rada Hay 02/04/2020, 5:11 PM

## 2020-02-05 LAB — MAGNESIUM: Magnesium: 2.6 mg/dL — ABNORMAL HIGH (ref 1.7–2.4)

## 2020-02-05 LAB — CBC WITH DIFFERENTIAL/PLATELET
Abs Immature Granulocytes: 0.55 10*3/uL — ABNORMAL HIGH (ref 0.00–0.07)
Basophils Absolute: 0 10*3/uL (ref 0.0–0.1)
Basophils Relative: 0 %
Eosinophils Absolute: 0.1 10*3/uL (ref 0.0–0.5)
Eosinophils Relative: 1 %
HCT: 42.7 % (ref 39.0–52.0)
Hemoglobin: 14.3 g/dL (ref 13.0–17.0)
Immature Granulocytes: 6 %
Lymphocytes Relative: 13 %
Lymphs Abs: 1.2 10*3/uL (ref 0.7–4.0)
MCH: 31.5 pg (ref 26.0–34.0)
MCHC: 33.5 g/dL (ref 30.0–36.0)
MCV: 94.1 fL (ref 80.0–100.0)
Monocytes Absolute: 1.2 10*3/uL — ABNORMAL HIGH (ref 0.1–1.0)
Monocytes Relative: 13 %
Neutro Abs: 6.3 10*3/uL (ref 1.7–7.7)
Neutrophils Relative %: 67 %
Platelets: 481 10*3/uL — ABNORMAL HIGH (ref 150–400)
RBC: 4.54 MIL/uL (ref 4.22–5.81)
RDW: 13 % (ref 11.5–15.5)
WBC: 9.4 10*3/uL (ref 4.0–10.5)
nRBC: 0 % (ref 0.0–0.2)

## 2020-02-05 LAB — COMPREHENSIVE METABOLIC PANEL
ALT: 51 U/L — ABNORMAL HIGH (ref 0–44)
AST: 23 U/L (ref 15–41)
Albumin: 2.7 g/dL — ABNORMAL LOW (ref 3.5–5.0)
Alkaline Phosphatase: 57 U/L (ref 38–126)
Anion gap: 9 (ref 5–15)
BUN: 12 mg/dL (ref 6–20)
CO2: 24 mmol/L (ref 22–32)
Calcium: 8.3 mg/dL — ABNORMAL LOW (ref 8.9–10.3)
Chloride: 103 mmol/L (ref 98–111)
Creatinine, Ser: 0.77 mg/dL (ref 0.61–1.24)
GFR calc Af Amer: >60 mL/min (ref >60)
GFR calc non Af Amer: >60 mL/min (ref >60)
Glucose, Bld: 118 mg/dL — ABNORMAL HIGH (ref 70–99)
Potassium: 4.1 mmol/L (ref 3.5–5.1)
Sodium: 136 mmol/L (ref 135–145)
Total Bilirubin: 0.7 mg/dL (ref 0.3–1.2)
Total Protein: 6.5 g/dL (ref 6.5–8.1)

## 2020-02-05 LAB — LACTATE DEHYDROGENASE: LDH: 302 U/L — ABNORMAL HIGH (ref 98–192)

## 2020-02-05 LAB — FERRITIN: Ferritin: 2160 ng/mL — ABNORMAL HIGH (ref 24–336)

## 2020-02-05 LAB — C-REACTIVE PROTEIN: CRP: 6.5 mg/dL — ABNORMAL HIGH (ref <1.0)

## 2020-02-05 LAB — PHOSPHORUS: Phosphorus: 3.2 mg/dL (ref 2.5–4.6)

## 2020-02-05 LAB — D-DIMER, QUANTITATIVE: D-Dimer, Quant: 0.62 ug/mL-FEU — ABNORMAL HIGH (ref 0.00–0.50)

## 2020-02-05 MED ORDER — ZINC SULFATE 220 (50 ZN) MG PO CAPS: 220.0000 mg | ORAL_CAPSULE | Freq: Every day | ORAL | 0 refills | Status: AC

## 2020-02-05 MED ORDER — DEXAMETHASONE 6 MG PO TABS: 6.0000 mg | ORAL_TABLET | Freq: Every day | ORAL | 0 refills | Status: AC

## 2020-02-05 MED ORDER — GUAIFENESIN-DM 100-10 MG/5ML PO SYRP: 10.0000 mL | ORAL_SOLUTION | ORAL | 0 refills | Status: AC | PRN

## 2020-02-05 MED ORDER — ASCORBIC ACID 500 MG PO TABS: 500.0000 mg | ORAL_TABLET | Freq: Every day | ORAL | 0 refills | Status: AC

## 2020-02-05 NOTE — Discharge Summary (Signed)
Physician Discharge Summary  Seth Lee HDQ:[id number] DOB: Mar 01, 1971 DOA: 01/29/2020  PCP: Pcp, No  Admit date: 01/29/2020 Discharge date: 02/05/2020  Time spent: 35 minutes  Covid vaccination; no vaccine   Acute respiratory failure with hypoxia/Covid pneumonia COVID-19 Labs  Recent Labs    02/03/20 0339 02/03/20 0914 02/04/20 0433 02/05/20 0348  DDIMER 0.81*  --  0.79* 0.62*  FERRITIN 1,726*  --  2,370* 2,160*  LDH  --  377* 342* 302*  CRP 0.8  --  3.8* 6.5*    Lab Results  Component Value Date   SARSCOV2NAA POSITIVE (A) 01/29/2020   SARSCOV2NAA Not Detected 03/31/2019   -Baricitinib 4 mg x 10 days per pharmacy protocol -Solu-Medrol 50 mg daily---> Decadron to complete 10 day course  -Remdesivir per pharmacy; completed course -Vitamin C plus zinc per Covid protocol -Respimat QID -Flutter valve -Incentive spirometer SATURATION QUALIFICATIONS: (This note is used to comply with regulatory documentation for home oxygen) Patient Saturations on Room Air at Rest = 90%-91% Patient Saturations on Room Air while Ambulating = 82% Patient Saturations on 4 Liters of oxygen while Ambulating = 88% Please briefly explain why patient needs home oxygen: pt needs home oxygen d/t desat while ambulating to 82%. Pt needs 4 L of O2 to maintain SPO2 of 88% with ambulation.  -Patient meets criteria for home O2 -4 L O2 titrate to ensure SPO2> 88% -Provide Inogen home O2 portable concentrator  --Followup with Dr Durel Salts PCCM on Tuesday October 19 at 1430  --You will be considered Contagious until 10/15/202. You will need to follow the below instructions to protect yourself and family.  Hyponatremia -Resolved      Discharge Diagnoses:  Principal Problem:   Pneumonia due to COVID-19 virus Active Problems:   Acute respiratory failure with hypoxia (HCC)   Dehydration with hyponatremia   Discharge Condition: Stable  Diet recommendation: Regular  Filed Weights   01/29/20  1929  Weight: 95.3 kg    History of present illness:  49 year old WM PMHx no significant history.   [facility]. Patient was diagnosed with COVID-19 September 15, positive sick contacts at home. Patient developed progressive dyspnea to the point where he became dyspneic with minimal efforts. Persistent dry cough, generalized malaise, loss of taste and smell and poor oral intake. He was [facility] at [facility] where he was found hypoxic, requiring 3 L of submental oxygen, blood pressure 110/85, heart rate 92, respiratory 25-29, temperature 98.6, oxygen saturation 92% on supplemental oxygen, lungs with Rales at bases bilaterally, no wheezing, heart S1-S2, present rhythmic, soft abdomen, no lower extremity edema. Sodium 130, potassium 3.7, chloride 88, bicarb 31, glucose 117, BUN 10, creatinine 0.96, white count 5.8, hemoglobin 15.6, hematocrit 45.5, platelets 215.SARS COVID-19 positive. Chest radiograph with bilateral interstitial infiltrates, predominantly right upper lobe/left lower lobeandfaint infiltrate at the right lower lobe.  Patient withhigh inflammatory markers and bilateral multilobar interstitial infiltrates, placed on baricitinib.  Hospital Course:  See above   Cultures  9/24 SARS coronavirus positive    Antibiotics Anti-infectives (From admission, onward)   Start     Ordered Stop   01/30/20 1000  remdesivir 100 mg in sodium chloride 0.9 % 100 mL IVPB       "Followed by" Linked Group Details   01/29/20 2129 02/02/20 1029   01/29/20 2200  remdesivir 100 mg in sodium chloride 0.9 % 100 mL IVPB       "Followed by" Linked  Group Details   01/29/20 2129 01/30/20 0025   01/29/20 2130  remdesivir 100 mg in sodium chloride 0.9 % 100 mL IVPB       "Followed by" Linked Group Details   01/29/20 2129 01/29/20 2344       Discharge Exam: Vitals:   02/04/20 1337 02/04/20 2026 02/05/20 0313  02/05/20 1311  BP: 124/80 127/83 127/83 112/76  Pulse: (!) 109 82 69 (!) 108  Resp: 18 18 (!) 21 20  Temp: 98.3 F (36.8 C) 97.8 F (36.6 C) 97.6 F (36.4 C) 98 F (36.7 C)  TempSrc: Oral Oral Oral Oral  SpO2: 91% 93% 95% 94%  Weight:      Height:        General: A/O x4, positive acute respiratory distress Eyes: negative scleral hemorrhage, negative anisocoria, negative icterus ENT: Negative Runny nose, negative gingival bleeding, Neck:  Negative scars, masses, torticollis, lymphadenopathy, JVD Lungs: decreased breath sounds bilaterally without wheezes or crackles Cardiovascular: Regular rate and rhythm without murmur gallop or rub normal S1 and S2 Discharge Instructions  Discharge Instructions    Diet - low sodium heart healthy   Complete by: As directed    Discharge instructions   Complete by: As directed    ?   Person Under Monitoring Name: Seth Lee  Location: [address] [place] [address]   Infection Prevention Recommendations for Individuals Confirmed to have, or Being Evaluated for, 2019 Novel Coronavirus (COVID-19) Infection Who Receive Care at Home  Individuals who are confirmed to have, or are being evaluated for, COVID-19 should follow the prevention steps below until a healthcare provider or local or state health department says they can return to normal activities.  Stay home except to get medical care You should restrict activities outside your home, except for getting medical care. Do not go to work, school, or public areas, and do not use public transportation or taxis.  Call ahead before visiting your doctor Before your medical appointment, call the healthcare provider and tell them that you have, or are being evaluated for, COVID-19 infection. This will help the healthcare provider's office take steps to keep other people from getting infected. Ask your healthcare provider to call the local or state health department.  Monitor your  symptoms Seek prompt medical attention if your illness is worsening (e.g., difficulty breathing). Before going to your medical appointment, call the healthcare provider and tell them that you have, or are being evaluated for, COVID-19 infection. Ask your healthcare provider to call the local or state health department.  Wear a facemask You should wear a facemask that covers your nose and mouth when you are in the same room with other people and when you visit a healthcare provider. People who live with or visit you should also wear a facemask while they are in the same room with you.  Separate yourself from other people in your home As much as possible, you should stay in a different room from other people in your home. Also, you should use a separate bathroom, if available.  Avoid sharing household items You should not share dishes, drinking glasses, cups, eating utensils, towels, bedding, or other items with other people in your home. After using these items, you should wash them thoroughly with soap and water.  Cover your coughs and sneezes Cover your mouth and nose with a tissue when you cough or sneeze, or you can cough or sneeze into your sleeve. Throw used tissues in a lined trash  can, and immediately wash your hands with soap and water for at least 20 seconds or use an alcohol-based hand rub.  Wash your [employer] your hands often and thoroughly with soap and water for at least 20 seconds. You can use an alcohol-based hand sanitizer if soap and water are not available and if your hands are not visibly dirty. Avoid touching your eyes, nose, and mouth with unwashed hands.   Prevention Steps for Caregivers and Household Members of Individuals Confirmed to have, or Being Evaluated for, COVID-19 Infection Being Cared for in the Home  If you live with, or provide care at home for, a person confirmed to have, or being evaluated for, COVID-19 infection please follow these guidelines  to prevent infection:  Follow healthcare provider's instructions Make sure that you understand and can help the patient follow any healthcare provider instructions for all care.  Provide for the patient's basic needs You should help the patient with basic needs in the home and provide support for getting groceries, prescriptions, and other personal needs.  Monitor the patient's symptoms If they are getting sicker, call his or her medical provider and tell them that the patient has, or is being evaluated for, COVID-19 infection. This will help the healthcare provider's office take steps to keep other people from getting infected. Ask the healthcare provider to call the local or state health department.  Limit the number of people who have contact with the patient If possible, have only one caregiver for the patient. Other household members should stay in another home or place of residence. If this is not possible, they should stay in another room, or be separated from the patient as much as possible. Use a separate bathroom, if available. Restrict visitors who do not have an essential need to be in the home.  Keep older adults, very young children, and other sick people away from the patient Keep older adults, very young children, and those who have compromised immune systems or chronic health conditions away from the patient. This includes people with chronic heart, lung, or kidney conditions, diabetes, and cancer.  Ensure good ventilation Make sure that shared spaces in the home have good air flow, such as from an air conditioner or an opened window, weather permitting.  Wash your hands often Wash your hands often and thoroughly with soap and water for at least 20 seconds. You can use an alcohol based hand sanitizer if soap and water are not available and if your hands are not visibly dirty. Avoid touching your eyes, nose, and mouth with unwashed hands. Use disposable paper towels to  dry your hands. If not available, use dedicated cloth towels and replace them when they become wet.  Wear a facemask and gloves Wear a disposable facemask at all times in the room and gloves when you touch or have contact with the patient's blood, body fluids, and/or secretions or excretions, such as sweat, saliva, sputum, nasal mucus, vomit, urine, or feces.  Ensure the mask fits over your nose and mouth tightly, and do not touch it during use. Throw out disposable facemasks and gloves after using them. Do not reuse. Wash your hands immediately after removing your facemask and gloves. If your personal clothing becomes contaminated, carefully remove clothing and launder. Wash your hands after handling contaminated clothing. Place all used disposable facemasks, gloves, and other waste in a lined container before disposing them with other household waste. Remove gloves and wash your hands immediately after handling these items.  Do not share dishes, glasses, or other household items with the patient Avoid sharing household items. You should not share dishes, drinking glasses, cups, eating utensils, towels, bedding, or other items with a patient who is confirmed to have, or being evaluated for, COVID-19 infection. After the person uses these items, you should wash them thoroughly with soap and water.  Wash laundry thoroughly Immediately remove and wash clothes or bedding that have blood, body fluids, and/or secretions or excretions, such as sweat, saliva, sputum, nasal mucus, vomit, urine, or feces, on them. Wear gloves when handling laundry from the patient. Read and follow directions on labels of laundry or clothing items and detergent. In general, wash and dry with the warmest temperatures recommended on the label.  Clean all areas the individual has used often Clean all touchable surfaces, such as counters, tabletops, doorknobs, bathroom fixtures, toilets, phones, keyboards, tablets, and bedside  tables, every day. Also, clean any surfaces that may have blood, body fluids, and/or secretions or excretions on them. Wear gloves when cleaning surfaces the patient has come in contact with. Use a diluted bleach solution (e.g., dilute bleach with 1 part bleach and 10 parts water) or a household disinfectant with a label that says EPA-registered for coronaviruses. To make a bleach solution at home, add 1 tablespoon of bleach to 1 quart (4 cups) of water. For a larger supply, add  cup of bleach to 1 gallon (16 cups) of water. Read labels of cleaning products and follow recommendations provided on product labels. Labels contain instructions for safe and effective use of the cleaning product including precautions you should take when applying the product, such as wearing gloves or eye protection and making sure you have good ventilation during use of the product. Remove gloves and wash hands immediately after cleaning.  Monitor yourself for signs and symptoms of illness Caregivers and household members are considered close contacts, should monitor their health, and will be asked to limit movement outside of the home to the extent possible. Follow the monitoring steps for close contacts listed on the symptom monitoring form.   ? If you have additional questions, contact your local health department or call the epidemiologist on call at [phone] (available 24/7). ? This guidance is subject to change. For the most up-to-date guidance from [facility], please refer to their website: [email or web address]   Increase activity slowly   Complete by: As directed      Allergies as of 02/05/2020   No Known Allergies     Medication List    STOP taking these medications   acetaminophen 325 MG tablet Commonly known as: TYLENOL   azithromycin 250 MG tablet Commonly known as: ZITHROMAX     TAKE these medications   albuterol 108 (90 Base) MCG/ACT  inhaler Commonly known as: VENTOLIN HFA Inhale 1-2 puffs into the lungs every 6 (six) hours as needed for wheezing or shortness of breath.   ascorbic acid 500 MG tablet Commonly known as: VITAMIN C Take 1 tablet (500 mg total) by mouth daily. Start taking on: February 06, 2020   dexamethasone 6 MG tablet Commonly known as: DECADRON Take 1 tablet (6 mg total) by mouth daily.   guaiFENesin-dextromethorphan 100-10 MG/5ML syrup Commonly known as: ROBITUSSIN DM Take 10 mLs by mouth every 4 (four) hours as needed for cough.   ondansetron 4 MG tablet Commonly known as: ZOFRAN Take 4 mg by mouth every 8 (eight) hours as needed for nausea or vomiting.   zinc sulfate 220 (50 Zn) MG  capsule Take 1 capsule (220 mg total) by mouth daily. Start taking on: February 06, 2020            Durable Medical Equipment  (From admission, onward)         Start     Ordered   02/05/20 1406  For home use only DME oxygen  Once       Comments: SATURATION QUALIFICATIONS: (This note is used to comply with regulatory documentation for home oxygen) Patient Saturations on Room Air at Rest = 90%-91% Patient Saturations on Room Air while Ambulating = 82% Patient Saturations on 4 Liters of oxygen while Ambulating = 88% Please briefly explain why patient needs home oxygen: pt needs home oxygen d/t desat while ambulating to 82%. Pt needs 4 L of O2 to maintain SPO2 of 88% with ambulation.  -Patient meets criteria for home O2 -4 L O2 titrate to ensure SPO2> 88% -Provide Inogen home O2 portable concentrator  Question Answer Comment  Length of Need Lifetime   Mode or (Route) Nasal cannula   Liters per Minute 4   Frequency Continuous (stationary and portable oxygen unit needed)   Oxygen conserving device Yes   Oxygen delivery system Gas      02/05/20 1405         No Known Allergies  Follow-up Information    Charlott Holler, MD Follow up on 02/23/2020.   Specialty: Pulmonary Disease Why: Followup with Dr  Durel Salts PCCM on Tuesday October 19 at 1430  Contact information: [address] [place] [address] [phone]                The results of significant diagnostics from this hospitalization (including imaging, microbiology, ancillary and laboratory) are listed below for reference.    Significant Diagnostic Studies: DG Chest Portable 1 View  Result Date: 01/29/2020 CLINICAL DATA:  COVID-19 positive, shortness of breath EXAM: PORTABLE CHEST 1 VIEW COMPARISON:  None. FINDINGS: Single frontal view of the chest demonstrates multifocal airspace disease greatest in the right upper lung zone. No effusion or pneumothorax. Cardiac silhouette is unremarkable. IMPRESSION: 1. Multifocal bilateral pneumonia compatible with COVID 19. Electronically Signed   By: Sharlet Salina M.D.   On: 01/29/2020 21:49    Microbiology: Recent Results (from the past 240 hour(s))  Blood Culture (routine x 2)     Status: None   Collection Time: 01/29/20  9:19 PM   Specimen: Left Antecubital; Blood  Result Value Ref Range Status   Specimen Description   Final    LEFT ANTECUBITAL Performed at [facility], [address] [place], [place] [address]    Special Requests   Final    BOTTLES DRAWN AEROBIC AND ANAEROBIC Blood Culture adequate volume Performed at [facility], [address] [place], [place] [address]    Culture   Final    NO GROWTH 5 DAYS Performed at [facility], [address], [place], [place] [address]    Report Status 02/04/2020 FINAL  Final  Blood Culture (routine x 2)     Status: None   Collection Time: 01/29/20  9:37 PM   Specimen: Right Antecubital; Blood  Result Value Ref Range Status   Specimen Description   Final    RIGHT ANTECUBITAL Performed at [facility], [address] [place], [place] [address]    Special Requests   Final    BOTTLES DRAWN AEROBIC AND ANAEROBIC Blood Culture adequate volume  [facility]formed at [facility], [address] [place], [place] [address]    Culture   Final    NO GROWTH 5 DAYS Performed at [facility], [address] [place], [place] [address]    Report Status 02/04/2020 FINAL  Final  SARS Coronavirus 2 by RT PCR (hospital order, performed [facility]) Nasopharyngeal Nasopharyngeal Swab     Status: Abnormal   Collection Time: 01/29/20 11:11 PM   Specimen: Nasopharyngeal Swab  Result Value Ref Range Status   SARS Coronavirus 2 POSITIVE (A) NEGATIVE Final    Comment: RESULT CALLED TO, READ BACK BY AND VERIFIED WITH: WALTON,M AT 0010 ON [id number] BY CHERESNOWSKY,T (NOTE) SARS-CoV-2 target nucleic acids are DETECTED  SARS-CoV-2 RNA is generally detectable in upper respiratory specimens  during the acute phase of infection.  Positive results are indicative  of the presence of the identified virus, but do not rule out bacterial infection or co-infection with other pathogens not detected by the test.  Clinical correlation with patient history and  other diagnostic information is necessary to determine patient infection status.  The expected result is negative.  Fact Sheet for Patients:   BoilerBrush.com.cyhttps://www.fda.gov/media/136312/download   Fact Sheet for Healthcare Providers:   https://pope.com/https://www.fda.gov/media/136313/download    This test is not yet approved or cleared by the [place] FDA and  has been authorized for detection and/or diagnosis of SARS-CoV-2 by FDA under an Emergency Use Authorization (EUA).  This EUA will remain in effect (meani ng this test can be used) for the duration of  the COVID-19 declaration under Section 564(b)(1) of the Act, 21 U.S.C. section 360-bbb-3(b)(1), unless the authorization is terminated or revoked sooner.  Performed at [facility], [address] [place], [place] [address]      Labs: Basic Metabolic Panel: Recent Labs  Lab 01/30/20 [id number] 01/31/20 0531 02/01/20 0433 02/02/20 0358  02/03/20 0339 02/03/20 0914 02/04/20 0433 02/05/20 0348  NA 131*   < > 134* 134* 137  --  136 136  K 4.0   < > 3.8 4.2 4.0  --  3.9 4.1  CL 92*   < > 96* 99 101  --  102 103  CO2 28   < > 30 27 26   --  23 24  GLUCOSE 166*   < > 159* 155* 123*  --  93 118*  BUN 11   < > 15 15 17   --  15 12  CREATININE 0.73   < > 0.66 0.63 0.70  --  0.83 0.77  CALCIUM 8.2*   < > 7.9* 8.2* 8.3*  --  8.4* 8.3*  MG 2.8*  --   --   --   --  2.3 2.5* 2.6*  PHOS  --   --   --   --   --  2.0* 2.6 3.2   < > = values in this interval not displayed.   Liver Function Tests: Recent Labs  Lab 02/01/20 0433 02/02/20 0358 02/03/20 0339 02/04/20 0433 02/05/20 0348  AST 50* 43* 36 30 23  ALT 91* 81* 77* 64* 51*  ALKPHOS 68 67 70 64 57  BILITOT 0.6 0.4 0.6 0.8 0.7  PROT 6.5 6.5 6.5 6.3* 6.5  ALBUMIN 2.8* 3.0* 3.0* 2.9* 2.7*   No results for input(s): LIPASE, AMYLASE in the last 168 hours. No results for input(s): AMMONIA in the last 168 hours. CBC: Recent Labs  Lab 01/29/20 2120 01/30/20 [id number]  02/03/20 0914 02/04/20 0433 02/05/20 0348  WBC 5.8 5.5 11.8* 10.9* 9.4  NEUTROABS 4.8 4.8 9.1* 7.8* 6.3  HGB 15.6 15.3 15.2 15.2 14.3  HCT 45.5 44.3 44.6 45.8 42.7  MCV 92.3 92.7 93.1 95.4 94.1  PLT 215 222 458* 470* 481*   Cardiac Enzymes: No results for input(s): CKTOTAL, CKMB, CKMBINDEX, TROPONINI in the last 168 hours. BNP: BNP (last 3 results) No results for input(s): BNP in the last 8760 hours.  ProBNP (last 3 results) No results for input(s): PROBNP in the last 8760 hours.  CBG: No results for input(s): GLUCAP in the last 168 hours.     Signed:  Carolyne Littles, MD [facility] [phone] pager

## 2020-02-05 NOTE — Progress Notes (Signed)
SATURATION QUALIFICATIONS: (This note is used to comply with regulatory documentation for home oxygen)  Patient Saturations on Room Air at Rest = 90%-91%  Patient Saturations on Room Air while Ambulating = 82%  Patient Saturations on 4 Liters of oxygen while Ambulating = 88%  Please briefly explain why patient needs home oxygen: pt needs home oxygen d/t desat while ambulating to 82%. Pt needs 4 L of O2 to maintain SPO2 of 88% with ambulation.

## 2020-02-05 NOTE — Progress Notes (Signed)
Occupational Therapy Treatment Patient Details Name: Seth Lee MRN: [id number] DOB: 12/16/1970 Today's Date: 02/05/2020    History of present illness Patient is a 49 year old male no significant past medical history, transferred from [facility] due to acute hypoxic respiratory failure, diagnosed with COVID 9/15   OT comments  Treatment focused on activity tolerance and cardiopulmonary endurance and monitoring of oxygen saturation in preparation for ADLs and IADLs on return home. Patient 88% on 4 Liters when therapist entered the room. Patient ambulated 400 feet in hall on 4 liters and dropped to 87% but recovered quickly with sitting rest break. Patient oxygen dropped to 2 liters then to RA while sitting. Patient o2 sat maintained above 92%. Attempted ambulation on RA with patient' o2 sat dropping to 82% at approx 200 feet.Required 4 liters to recover to 97%. Reduced o2 to 2 liters and patinet o2 sat 94%. Patient placed on RA and maintained at 90-91%. Patient had no complaints of shortness of breath or fatigue throughout treatment.    Follow Up Recommendations  No OT follow up    Equipment Recommendations  None recommended by OT    Recommendations for Other Services      Precautions / Restrictions Precautions Precautions: None Precaution Comments: monitor O2 saturations Restrictions Weight Bearing Restrictions: No       Mobility Bed Mobility Overal bed mobility: Modified Independent                Transfers Overall transfer level: Independent               General transfer comment: Ambulated with independence today.    Balance Overall balance assessment: No apparent balance deficits (not formally assessed)                                         ADL either performed or assessed with clinical judgement   ADL                                               Vision       Perception     Praxis       Cognition Arousal/Alertness: Awake/alert Behavior During Therapy: WFL for tasks assessed/performed Overall Cognitive Status: Within Functional Limits for tasks assessed                                          Exercises     Shoulder Instructions       General Comments      Pertinent Vitals/ Pain       Pain Assessment: No/denies pain  Home Living                                          Prior Functioning/Environment              Frequency  Min 2X/week        Progress Toward Goals  OT Goals(current goals can now be found in the care plan section)  Progress towards OT goals: Progressing toward goals  Acute Rehab  OT Goals Patient Stated Goal: fishing, yardwork OT Goal Formulation: With patient Time For Goal Achievement: 02/16/20 Potential to Achieve Goals: Good  Plan Discharge plan remains appropriate    Co-evaluation                 AM-PAC OT "6 Clicks" Daily Activity     Outcome Measure   Help from another person eating meals?: None Help from another person taking care of personal grooming?: None Help from another person toileting, which includes using toliet, bedpan, or urinal?: None Help from another person bathing (including washing, rinsing, drying)?: None Help from another person to put on and taking off regular upper body clothing?: None Help from another person to put on and taking off regular lower body clothing?: None 6 Click Score: 24    End of Session Equipment Utilized During Treatment: Oxygen  OT Visit Diagnosis: Muscle weakness (generalized) (M62.81)   Activity Tolerance Patient tolerated treatment well   Patient Left in chair;with call bell/phone within reach   Nurse Communication  (o2 sat, on Room air)        Time: 3086-5784 OT Time Calculation (min): 30 min  Charges: OT General Charges $OT Visit: 1 Visit OT Treatments $Therapeutic Activity: 23-37 mins  Tamy Accardo, OTR/L Acute Care  Rehab Services  Office [phone] Pager: [phone]    Kelli Churn 02/05/2020, 12:55 PM

## 2020-02-05 NOTE — TOC Progression Note (Addendum)
Transition of Care [facility]) - Progression Note    Patient Details  Name: Seth Lee MRN: [id number] Date of Birth: Sep 16, 1970  Transition of Care [facility]) CM/SW Contact  Armanda Heritage, RN Phone Number: 02/05/2020, 12:51 PM  Clinical Narrative:    CM spoke with patient regarding need for home oxygen and HHPT recommendations.  Patient declines HH services at this time, reports his daughters are nurses and can assist him as needed.  [employer] given referral for home oxygen.  Patient reports his children can transport him home at time of discharge.  CM also spoke with patient regarding need for pcp follow-up.  Patient advised to reach out to his insurance carrier to obtain a list of in-network providers and select pcp.   Expected Discharge Plan: Home/Self Care Barriers to Discharge: Continued Medical Work up  Expected Discharge Plan and Services Expected Discharge Plan: Home/Self Care   Discharge Planning Services: CM Consult   Living arrangements for the past 2 months: Single Family Home                 DME Arranged: Oxygen DME Agency: [employer] Date DME Agency Contacted: 02/05/20 Time DME Agency Contacted: 1250 Representative spoke with at DME Agency: Ian Malkin HH Arranged: Refused HH           Social Determinants of Health (SDOH) Interventions    Readmission Risk Interventions No flowsheet data found.

## 2020-02-23 ENCOUNTER — Encounter: Payer: Self-pay | Admitting: Internal Medicine

## 2020-02-23 ENCOUNTER — Other Ambulatory Visit: Payer: Self-pay

## 2020-02-23 ENCOUNTER — Ambulatory Visit (INDEPENDENT_AMBULATORY_CARE_PROVIDER_SITE_OTHER): Payer: 59 | Admitting: Internal Medicine

## 2020-02-23 VITALS — BP 110/80 | HR 118 | Temp 97.6°F | Ht 69.0 in | Wt 200.4 lb

## 2020-02-23 DIAGNOSIS — J9601 Acute respiratory failure with hypoxia: Secondary | ICD-10-CM

## 2020-02-23 DIAGNOSIS — U071 COVID-19: Secondary | ICD-10-CM

## 2020-02-23 NOTE — Patient Instructions (Addendum)
Recommend getting vaccinated COVID 19 90 days after

## 2020-02-23 NOTE — Progress Notes (Signed)
Seth Lee    [id number]    11-01-1970  Primary Care Physician:Pcp, No  Referring Physician: Dr. Joseph Art, appointment made at hospital discharge Reason for Consultation: shortness of breath after covid Date of Consultation: 02/23/2020  Chief complaint:   Chief Complaint  Patient presents with  . Hospitalization Follow-up    [facility] has gotten better.  still using the oxygen 24/7 at 1 liter.  will use oxygen at 2 liters if outside and it is hot. feels better today.      HPI: Seth Lee is a 49 y.o. gentleman who was diagnosed with covid 19 pneumonia in September.  Hospitalized and treated with steroids, remdesevir and monoclonal ab. Discharged on 4LNC oxygen.  Here for follow up today.  Has a puls-oximeter at home and he drops down to 89%. Some morning cough. Some morning chest tightness and wheezing. Eating and drinking ok now, but lost 25 lbs unintentionally during the early part of his infection. He feels like he is back to 75% of normal. Was given albuterol and he feels this helps him.  DME - [employer].   Social history:  Occupation: works as an Camera operator, works from home.  Exposures: lives at home independently.  Smoking history: 2 PPD x 20 years = 40 pack year history.    Social History   Occupational History  . Not on file  Tobacco Use  . Smoking status: Former Smoker    Types: Cigarettes    Quit date: 02/14/2011    Years since quitting: 9.0  . Smokeless tobacco: Never Used  Vaping Use  . Vaping Use: Never used  Substance and Sexual Activity  . Alcohol use: Yes    Comment: occ  . Drug use: Never  . Sexual activity: Not on file    Relevant family history:  Family History  Problem Relation Age of Onset  . Diabetes Father   . Lung cancer Maternal Grandmother   . Lung cancer Maternal Grandfather     History reviewed. No pertinent past medical history.  History reviewed. No pertinent surgical history.   Physical Exam: Blood pressure  110/80, pulse (!) 118, temperature 97.6 F (36.4 C), temperature source Oral, height 5\' 9"  (1.753 m), weight 200 lb 6 oz (90.9 kg), SpO2 95 %. Gen:      No acute distress ENT:  no nasal polyps, mucus membranes moist Lungs:    No increased respiratory effort, symmetric chest wall excursion, clear to auscultation bilaterally, no wheezes or crackles CV:         Regular rate and rhythm; no murmurs, rubs, or gallops.  No pedal edema Abd:      + bowel sounds; soft, non-tender; no distension MSK: no acute synovitis of DIP or PIP joints, no mechanics hands.  Skin:      Warm and dry; no rashes Neuro: normal speech, no focal facial asymmetry Psych: alert and oriented x3, normal mood and affect   Data Reviewed/Medical Decision Making:  Independent interpretation of tests: Imaging: . Review of patient's chest xray 9/24 images revealed multifocal pna consistent with covid 19. The patient's images have been independently reviewed by me.    PFTs: None  Labs:  Lab Results  Component Value Date   NA 136 02/05/2020   K 4.1 02/05/2020   CL 103 02/05/2020   CO2 24 02/05/2020   Lab Results  Component Value Date   WBC 9.4 02/05/2020   HGB 14.3 02/05/2020   HCT  42.7 02/05/2020   MCV 94.1 02/05/2020   PLT 481 (H) 02/05/2020     Immunization status:   There is no immunization history on file for this patient.  . I reviewed prior external note(s) from hospital stay . I reviewed the result(s) of the labs and imaging as noted above.  . I have ordered amatory desat study   Assessment:  COVID 19 infection On home oxygen    Plan/Recommendations: We will ambulate for desaturation study today.  Can discontinue home oxygen if his saturations are staying over 88%.  He can follow-up as needed.  Recommend vaccination 90 days after his positive test.  We discussed disease management and progression at length today.    Return to Care: As needed  Durel Salts, MD Pulmonary and Critical Care  Medicine [facility] [phone]  CC: No ref. provider found

## 2021-01-24 ENCOUNTER — Emergency Department (HOSPITAL_COMMUNITY)
Admission: EM | Admit: 2021-01-24 | Discharge: 2021-01-24 | Disposition: A | Discharge status: Home / Self Care | Payer: 59 | Attending: Emergency Medicine | Admitting: Emergency Medicine

## 2021-01-24 ENCOUNTER — Encounter (HOSPITAL_COMMUNITY): Payer: Self-pay | Admitting: Emergency Medicine

## 2021-01-24 ENCOUNTER — Emergency Department (HOSPITAL_COMMUNITY): Payer: 59

## 2021-01-24 DIAGNOSIS — Z87891 Personal history of nicotine dependence: Secondary | ICD-10-CM | POA: Insufficient documentation

## 2021-01-24 DIAGNOSIS — Z8616 Personal history of COVID-19: Secondary | ICD-10-CM | POA: Diagnosis not present

## 2021-01-24 DIAGNOSIS — R200 Anesthesia of skin: Secondary | ICD-10-CM | POA: Diagnosis present

## 2021-01-24 DIAGNOSIS — M5412 Radiculopathy, cervical region: Secondary | ICD-10-CM | POA: Diagnosis not present

## 2021-01-24 LAB — BASIC METABOLIC PANEL
Anion gap: 7 (ref 5–15)
BUN: 10 mg/dL (ref 6–20)
CO2: 28 mmol/L (ref 22–32)
Calcium: 9.5 mg/dL (ref 8.9–10.3)
Chloride: 106 mmol/L (ref 98–111)
Creatinine, Ser: 0.87 mg/dL (ref 0.61–1.24)
GFR, Estimated: >60 mL/min (ref >60)
Glucose, Bld: 116 mg/dL — ABNORMAL HIGH (ref 70–99)
Potassium: 4 mmol/L (ref 3.5–5.1)
Sodium: 141 mmol/L (ref 135–145)

## 2021-01-24 LAB — CBC WITH DIFFERENTIAL/PLATELET
Abs Immature Granulocytes: 0.01 10*3/uL (ref 0.00–0.07)
Basophils Absolute: 0.1 10*3/uL (ref 0.0–0.1)
Basophils Relative: 1 %
Eosinophils Absolute: 0.2 10*3/uL (ref 0.0–0.5)
Eosinophils Relative: 3 %
HCT: 48.2 % (ref 39.0–52.0)
Hemoglobin: 16.4 g/dL (ref 13.0–17.0)
Immature Granulocytes: 0 %
Lymphocytes Relative: 30 %
Lymphs Abs: 1.6 10*3/uL (ref 0.7–4.0)
MCH: 32.7 pg (ref 26.0–34.0)
MCHC: 34 g/dL (ref 30.0–36.0)
MCV: 96 fL (ref 80.0–100.0)
Monocytes Absolute: 0.5 10*3/uL (ref 0.1–1.0)
Monocytes Relative: 10 %
Neutro Abs: 3.1 10*3/uL (ref 1.7–7.7)
Neutrophils Relative %: 56 %
Platelets: 271 10*3/uL (ref 150–400)
RBC: 5.02 MIL/uL (ref 4.22–5.81)
RDW: 13.6 % (ref 11.5–15.5)
WBC: 5.4 10*3/uL (ref 4.0–10.5)
nRBC: 0 % (ref 0.0–0.2)

## 2021-01-24 LAB — TROPONIN I (HIGH SENSITIVITY)
Troponin I (High Sensitivity): 4 ng/L (ref <18)
Troponin I (High Sensitivity): 3 ng/L (ref <18)

## 2021-01-24 IMAGING — CT CT HEAD W/O CM
3 series · 15 of 47 positions shown, 18 images · non-contrast
Comparison: None.

CLINICAL DATA: Neurologic deficit.  Left arm numbness.

EXAM:
CT HEAD WITHOUT CONTRAST
TECHNIQUE: Contiguous axial images were obtained from the base of the skull
through the vertex without intravenous contrast.

[Series 2: head wo · axial · 0.48mm/px · z∈[-56,+69]mm · 9 of 31 slices shown, 12 images]
[im 3/31  brain]
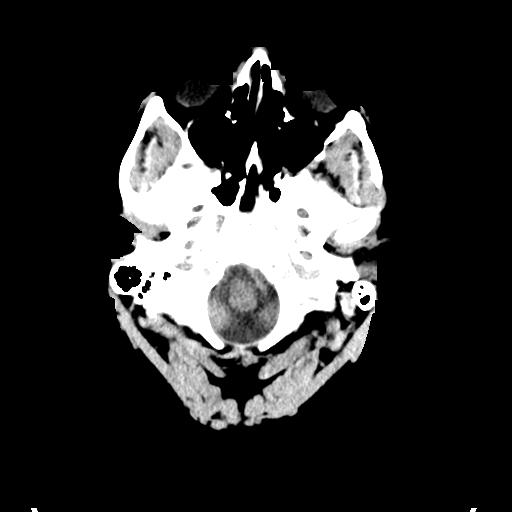
[im 3/31  bone]
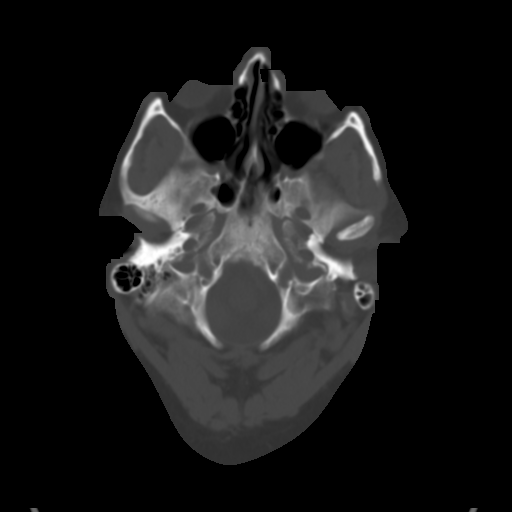
[im 6/31  brain]
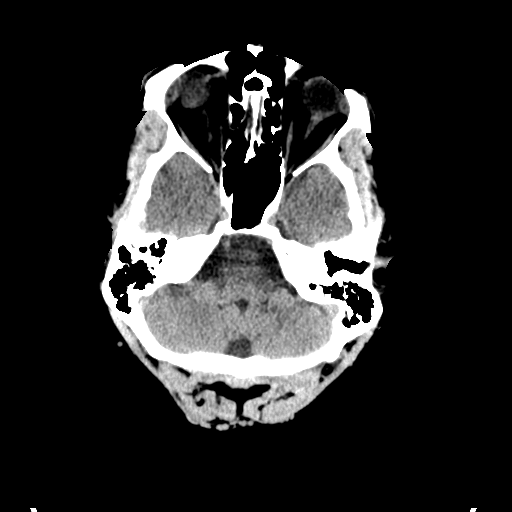
[im 9/31  brain]
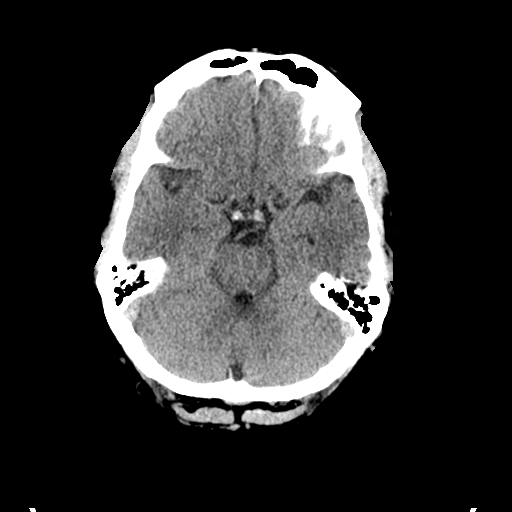
[im 12/31  brain]
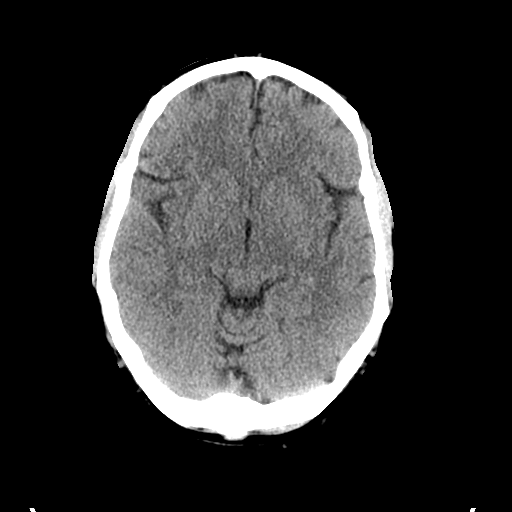
[im 16/31  brain]
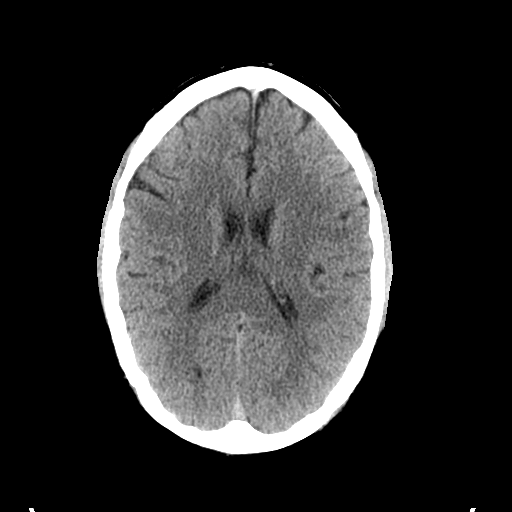
[im 16/31  bone]
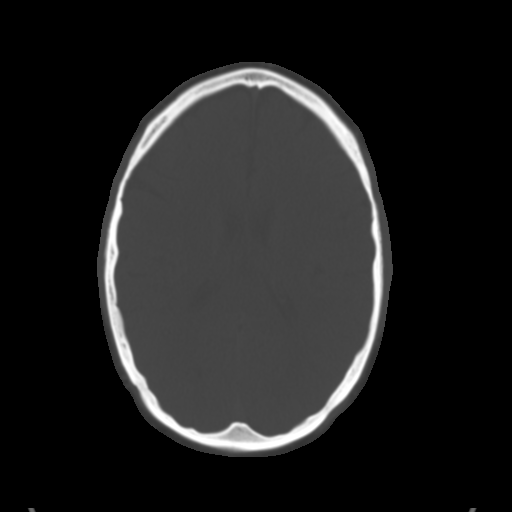
[im 19/31  brain]
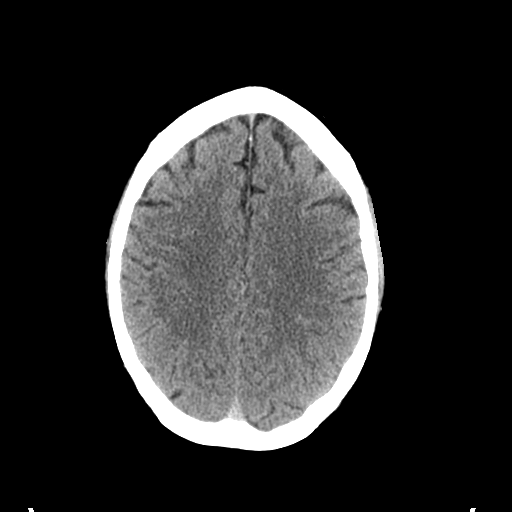
[im 22/31  brain]
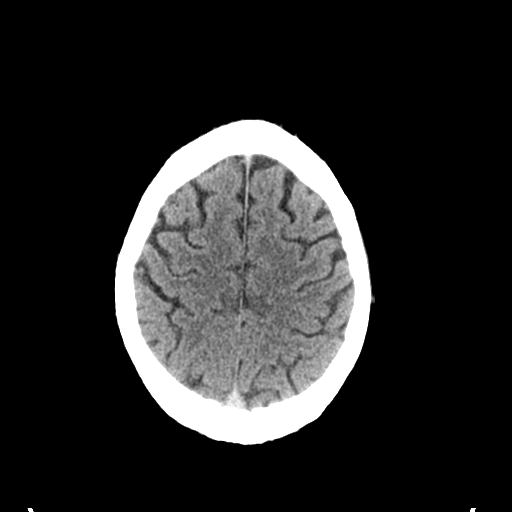
[im 25/31  brain]
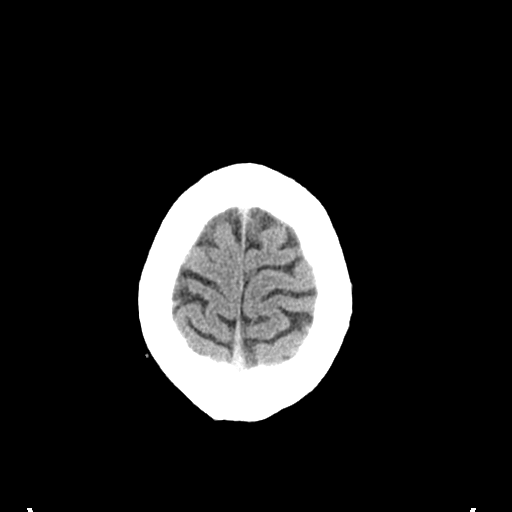
[im 28/31  brain]
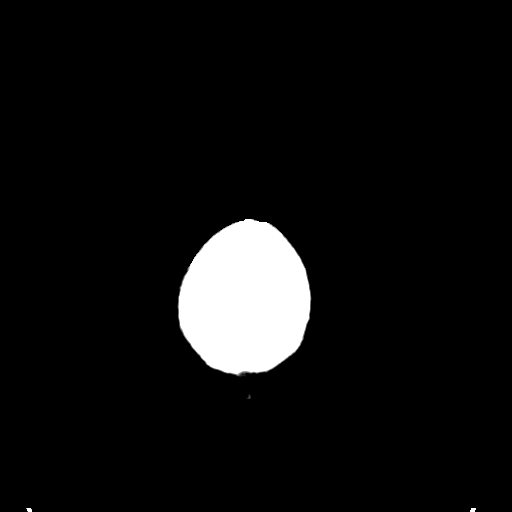
[im 28/31  bone]
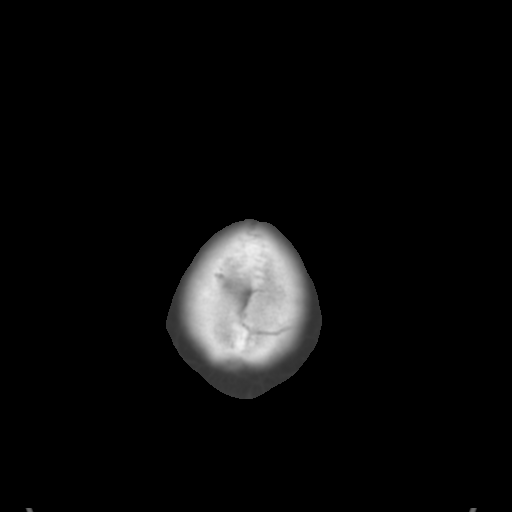

[Series 4: coronal soft tissue · coronal · 0.33mm/px · 3 of 78 slices shown]
[im 26/78  brain]
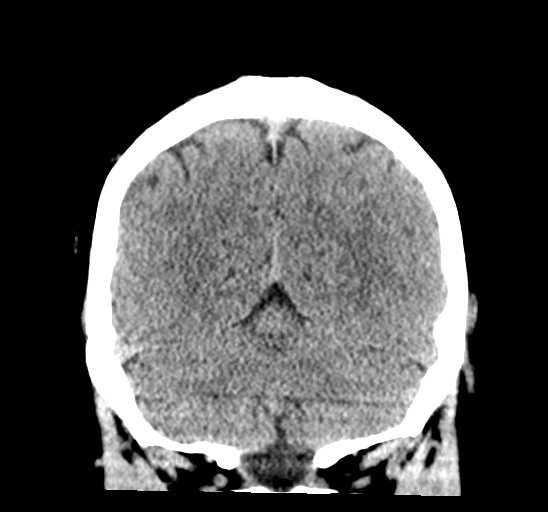
[im 35/78  brain]
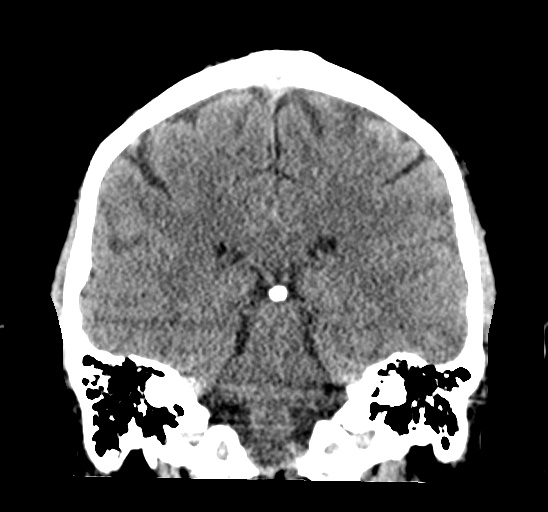
[im 43/78  brain]
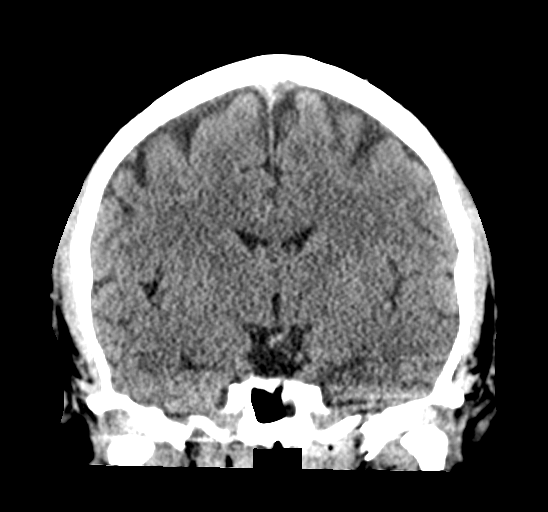

[Series 5: sagittal soft tissue · sagittal · 0.32mm/px · 3 of 58 slices shown]
[im 20/58  brain]
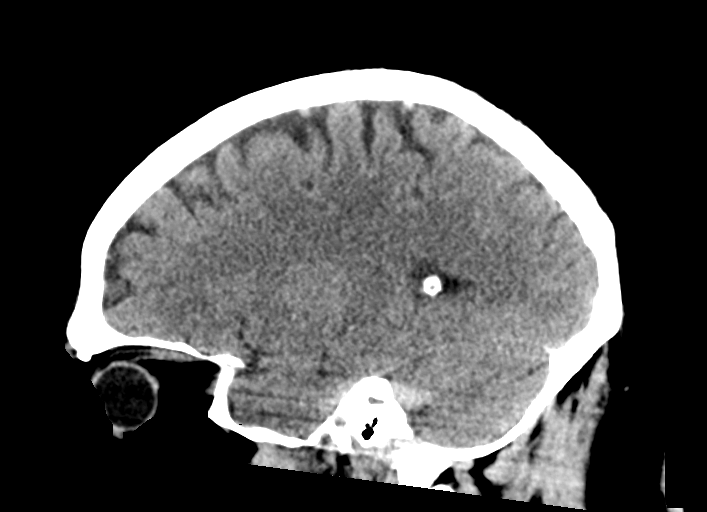
[im 29/58  brain]
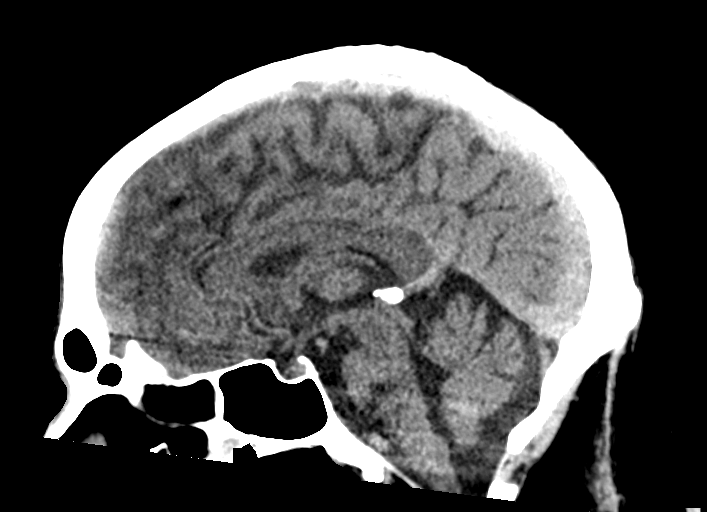
[im 39/58  brain]
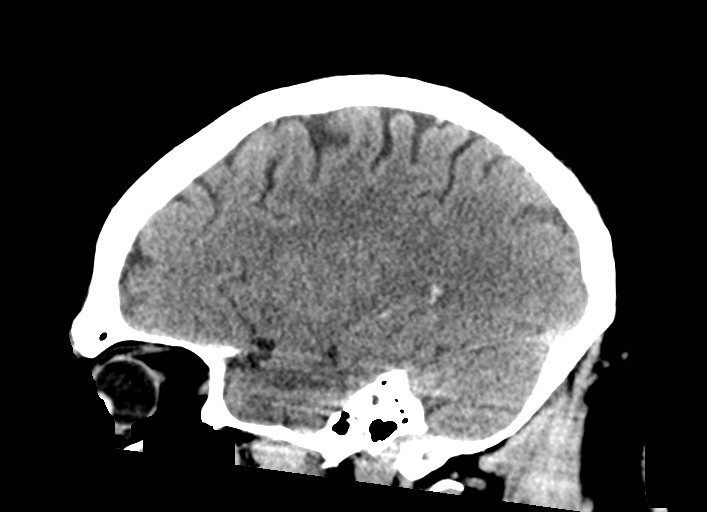

[15 of 47 positions shown; findings below may reference images not displayed]

FINDINGS: Brain: No evidence of acute infarction, hemorrhage, hydrocephalus,
extra-axial collection or mass lesion/mass effect.

Vascular: No hyperdense vessel or unexpected calcification.

Skull: Normal. Negative for fracture or focal lesion.

Sinuses/Orbits: No acute finding.

Other: None
IMPRESSION: No acute intracranial abnormalities. Normal brain.

## 2021-01-24 IMAGING — CR DG CHEST 2V
2 series · 2 of 2 positions shown · non-contrast
Comparison: [DATE]

CLINICAL DATA: Left arm numbness

EXAM:
CHEST - 2 VIEW

[w chest pa]
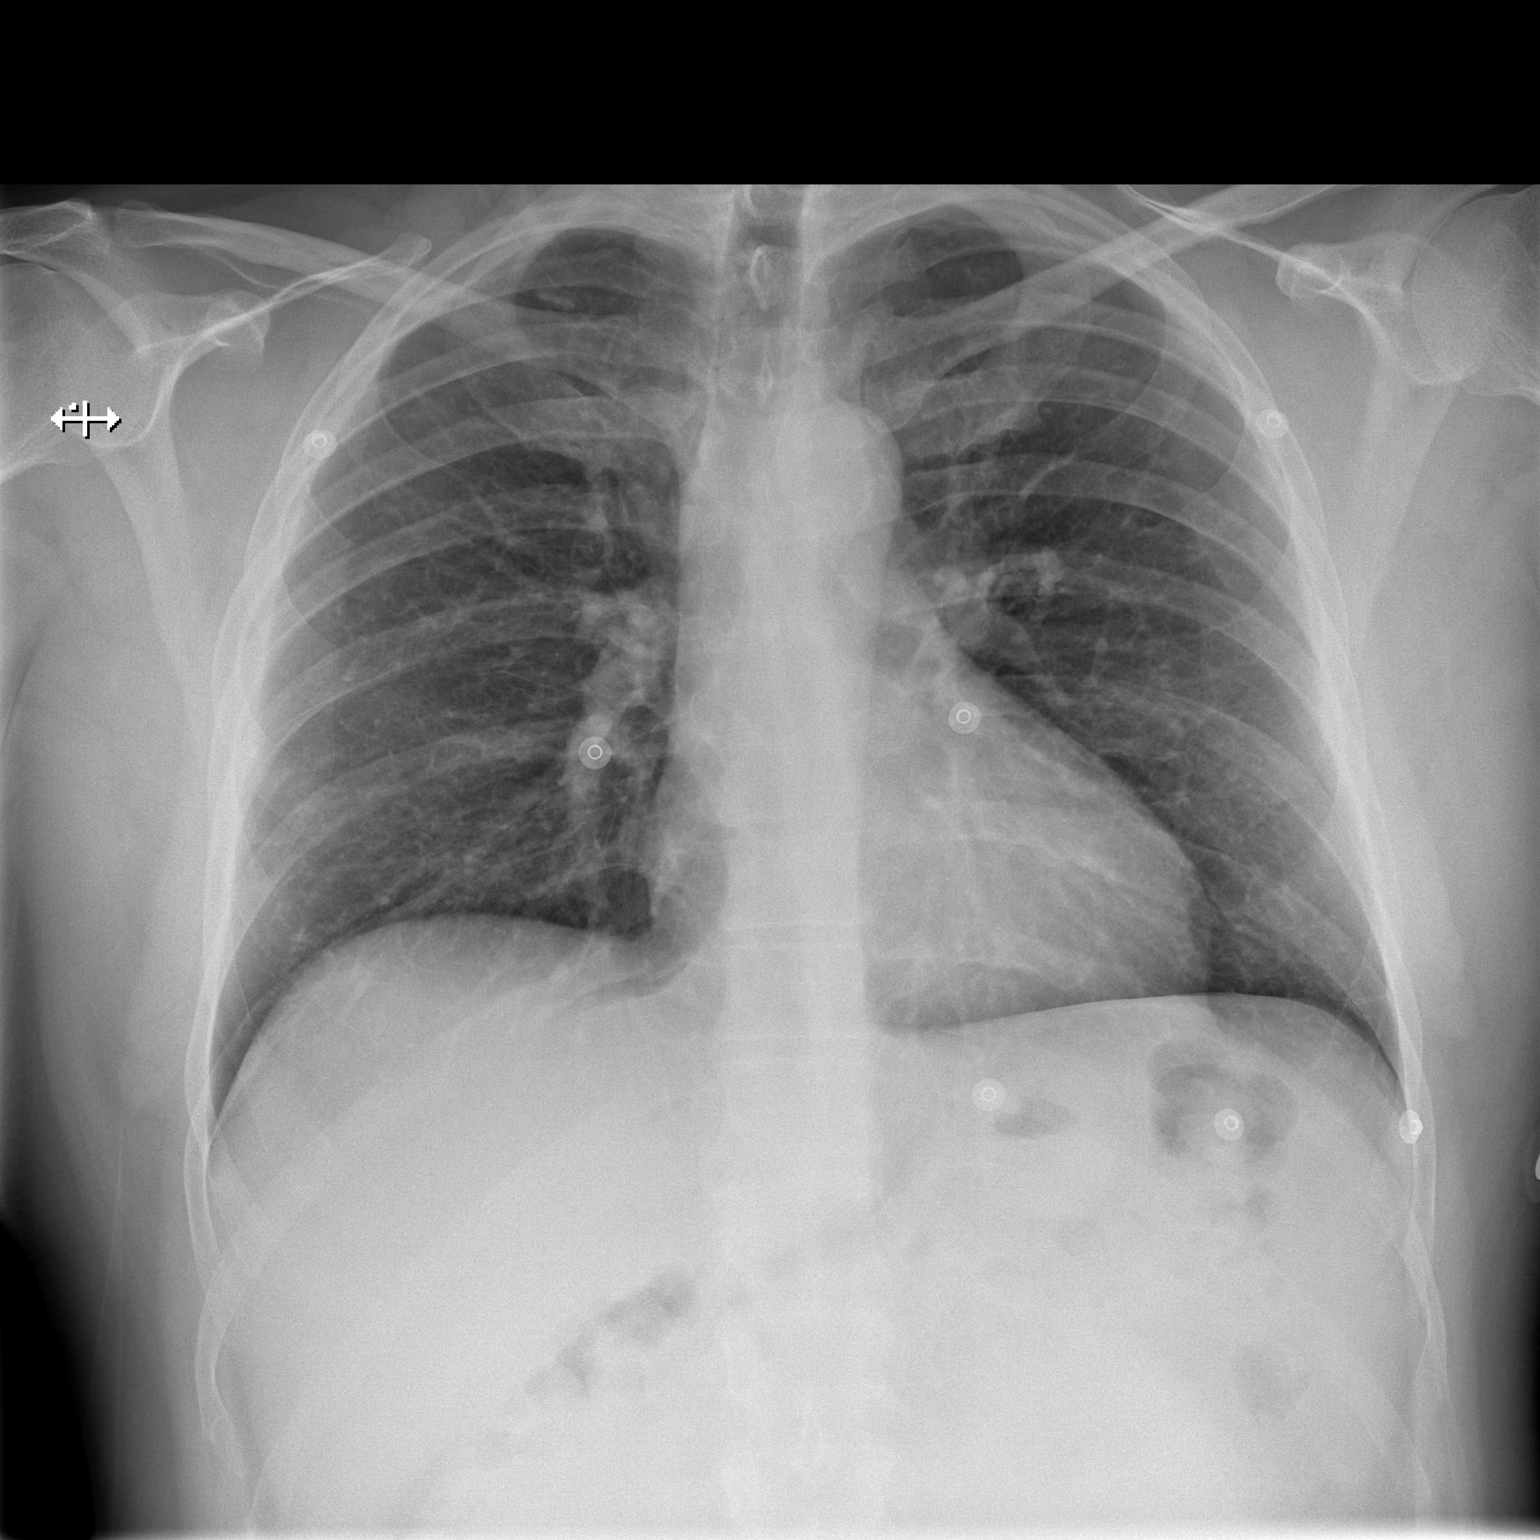

[w chest lat]
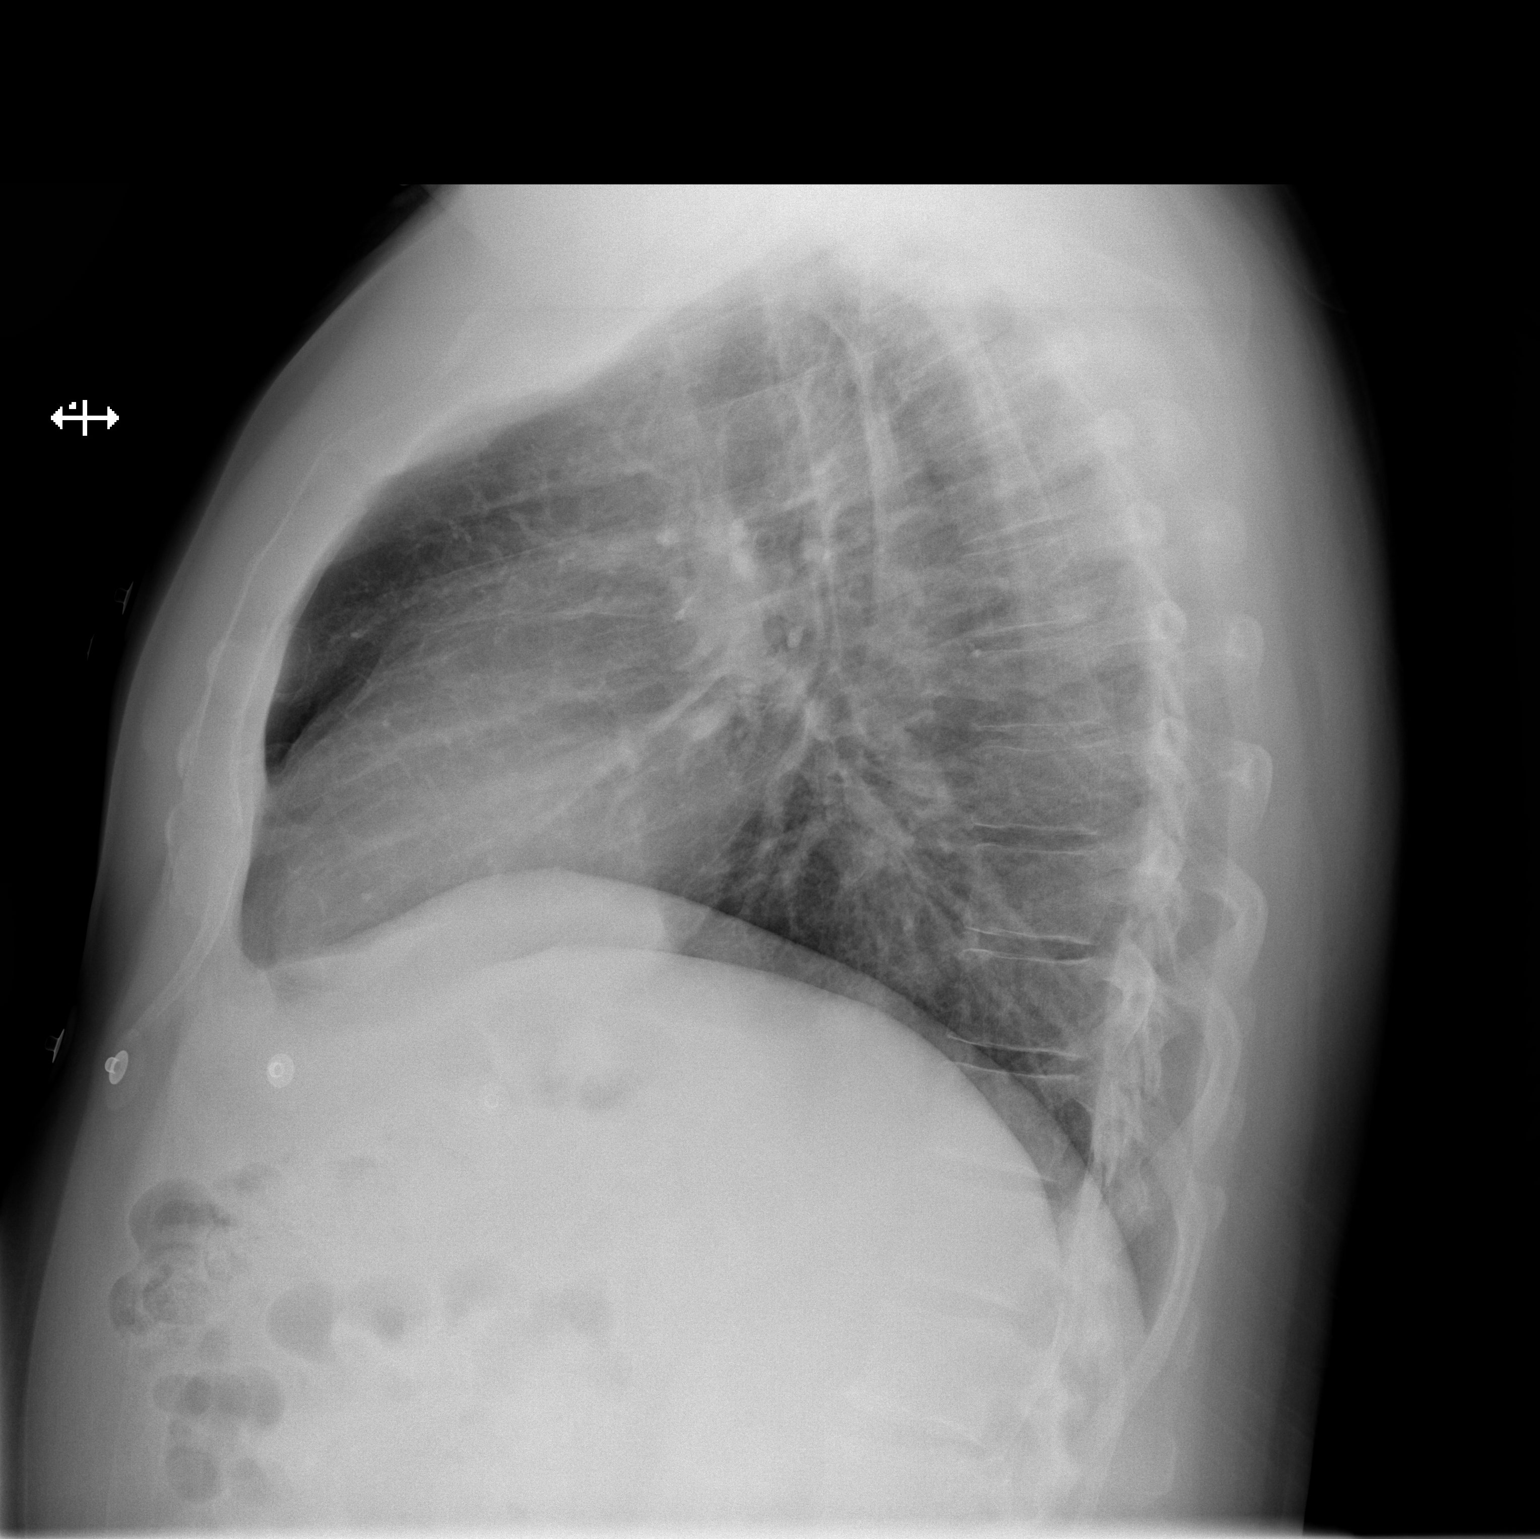

[2 of 2 positions shown; findings below may reference images not displayed]

FINDINGS: The heart size and mediastinal contours are within normal limits.
Both lungs are clear. The visualized skeletal structures are
unremarkable.
IMPRESSION: No active cardiopulmonary disease.

## 2021-01-24 IMAGING — MR MR HEAD W/O CM
10 series · 48 of 48 positions shown · non-contrast
Comparison: Prior CT from earlier the same day.

CLINICAL DATA: Initial evaluation for neuro deficit, stroke
suspected.

EXAM:
MRI HEAD WITHOUT CONTRAST
TECHNIQUE: Multiplanar, multiecho pulse sequences of the brain and surrounding
structures were obtained without intravenous contrast.

[Series 5: T1 · sagittal · 5.0mm · 0.75mm/px · 2 of 24 slices shown (1 of 2)]
[im 1/24]
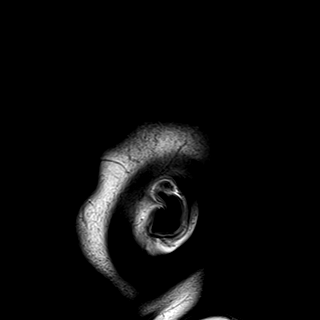
[im 24/24]
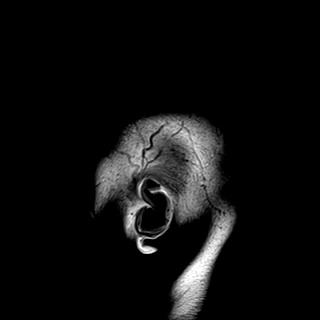

[Series 6: DWI · axial · 3.0mm · 1.36mm/px · z∈[-8,+132]mm · 8 of 95 slices shown (1 of 2)]
[im 1/95]
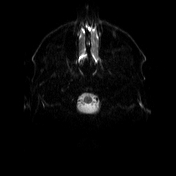
[im 14/95]
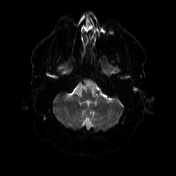
[im 27/95]
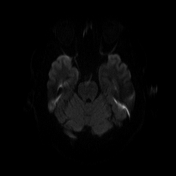
[im 41/95]
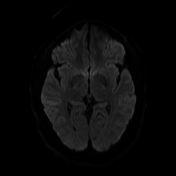
[im 54/95]
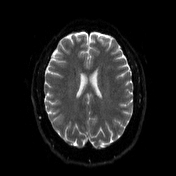
[im 68/95]
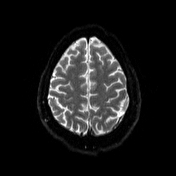
[im 81/95]
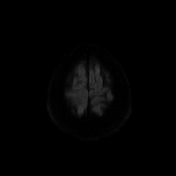
[im 95/95]
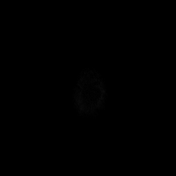

[Series 7: DWI · axial · 3.0mm · 1.36mm/px · z∈[-8,+132]mm · 4 of 48 slices shown (2 of 2)]
[im 1/48]
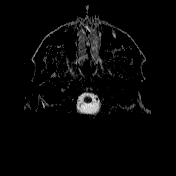
[im 16/48]
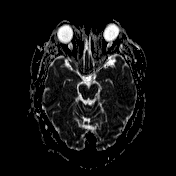
[im 32/48]
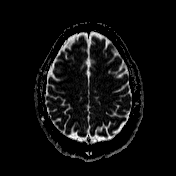
[im 48/48]
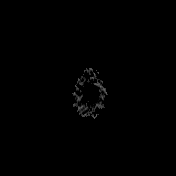

[Series 8: T2 · axial · 5.0mm · 0.62mm/px · z∈[-21,+140]mm · 2 of 26 slices shown (1 of 2)]
[im 1/26]
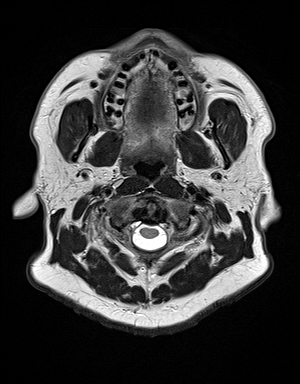
[im 26/26]
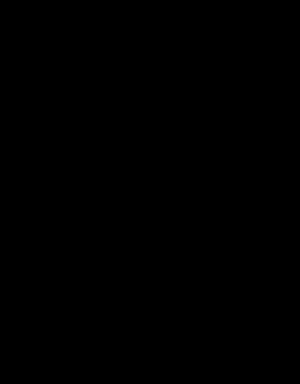

[Series 9: swi_images · axial · 3.0mm · 0.75mm/px · z∈[-28,+147]mm · 5 of 60 slices shown]
[im 1/60]
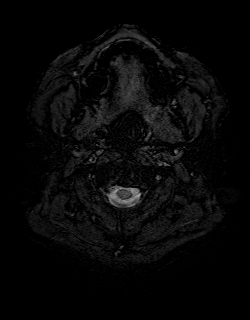
[im 15/60]
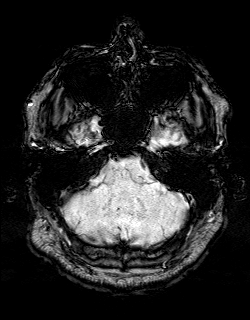
[im 30/60]
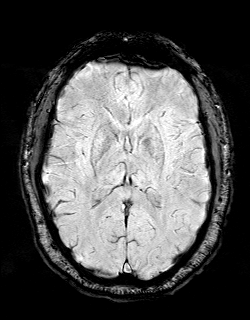
[im 45/60]
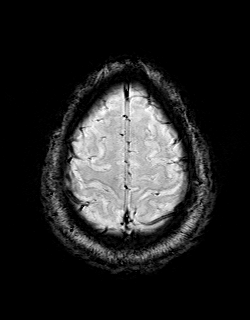
[im 60/60]
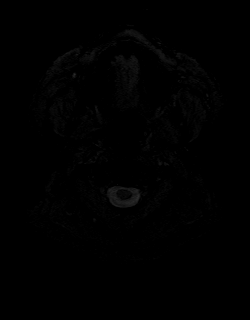

[Series 11: FLAIR · axial · 3.0mm · 0.75mm/px · z∈[-16,+135]mm · 4 of 52 slices shown]
[im 1/52]
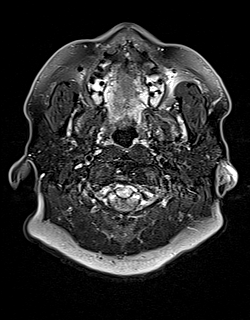
[im 18/52]
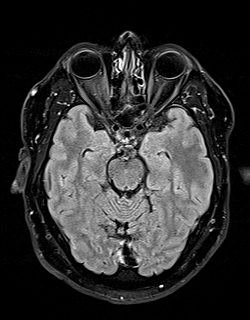
[im 35/52]
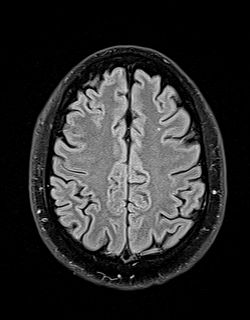
[im 52/52]
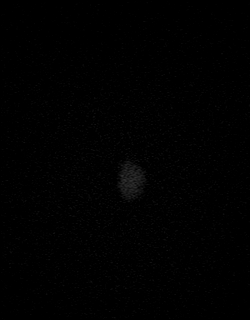

[Series 12: T1 · axial · 1.0mm · 0.94mm/px · z∈[-19,+138]mm · 13 of 160 slices shown (2 of 2)]
[im 1/160]
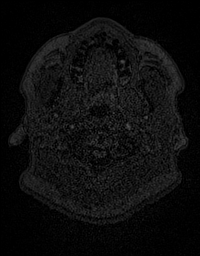
[im 14/160]
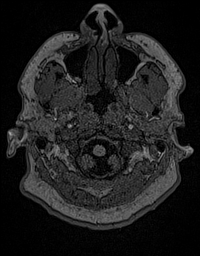
[im 27/160]
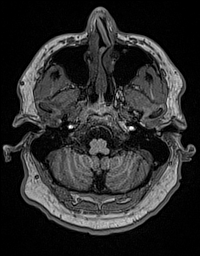
[im 40/160]
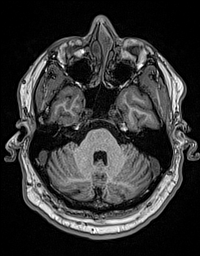
[im 54/160]
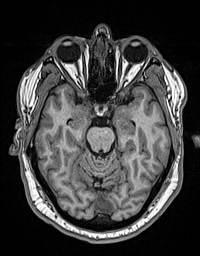
[im 67/160]
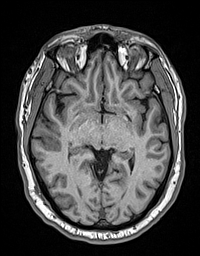
[im 80/160]
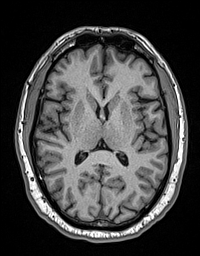
[im 93/160]
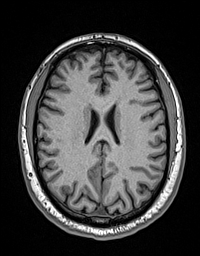
[im 107/160]
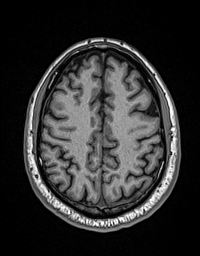
[im 120/160]
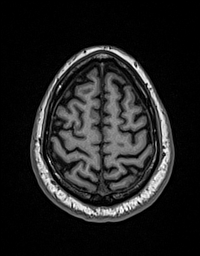
[im 133/160]
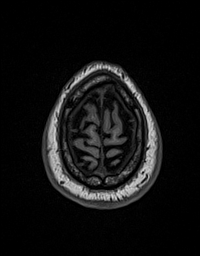
[im 146/160]
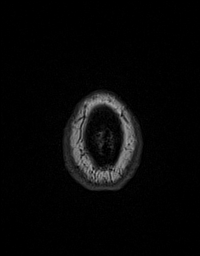
[im 160/160]
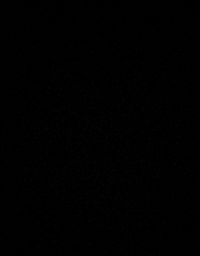

[Series 13: cor dwi_tracew · coronal · 5.0mm · 1.53mm/px · 5 of 56 slices shown]
[im 1/56]
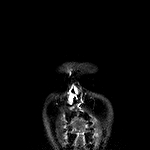
[im 14/56]
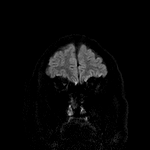
[im 28/56]
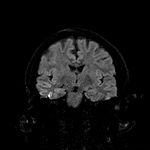
[im 42/56]
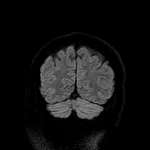
[im 56/56]
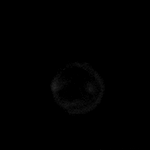

[Series 14: cor dwi_adc · coronal · 5.0mm · 1.53mm/px · 2 of 28 slices shown]
[im 1/28]
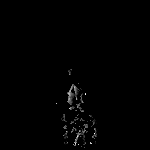
[im 28/28]
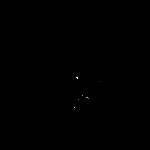

[Series 15: T2 · coronal · 5.0mm · 0.57mm/px · 3 of 32 slices shown (2 of 2)]
[im 1/32]
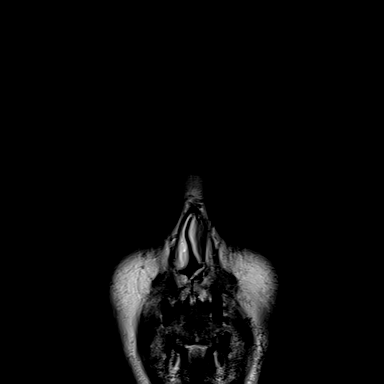
[im 16/32]
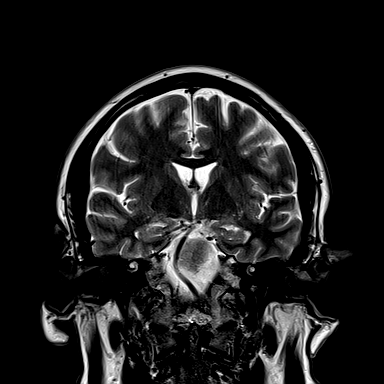
[im 32/32]
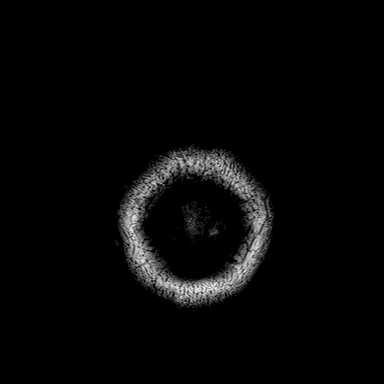

[48 of 48 positions shown; findings below may reference images not displayed]

FINDINGS: Brain: Cerebral volume within normal limits for patient age. Few
scattered subcentimeter foci of FLAIR hyperintensity noted involving
the supratentorial cerebral white matter of the left cerebral
hemisphere, nonspecific, but overall minimal in nature in felt to be
within normal limits for age.

No abnormal foci of restricted diffusion to suggest acute or
subacute ischemia. Gray-white matter differentiation well
maintained. No encephalomalacia to suggest chronic infarction. No
foci of susceptibility artifact to suggest acute or chronic
intracranial hemorrhage.

No mass lesion, midline shift or mass effect. No hydrocephalus. No
extra-axial fluid collection.

Pituitary gland and suprasellar region are normal. Midline
structures intact and normal.

Vascular: Major intracranial vascular flow voids well maintained and
normal in appearance.

Skull and upper cervical spine: Craniocervical junction normal.
Visualized upper cervical spine within normal limits. Bone marrow
signal intensity normal. No scalp soft tissue abnormality.

Sinuses/Orbits: Globes and orbital soft tissues within normal
limits.

Few small maxillary sinus retention cyst noted. Paranasal sinuses
are otherwise clear. No mastoid effusion. Inner ear structures
grossly normal.

Other: 7 mm cystic lesion noted at the left periauricular soft
tissues (series 8, image 6), nonspecific, but of doubtful
significance.
IMPRESSION: Normal brain MRI for age. No acute intracranial abnormality
identified.

## 2021-01-24 IMAGING — MR MR CERVICAL SPINE W/O CM
6 series · 37 of 48 positions shown · non-contrast
Comparison: None.

CLINICAL DATA: Initial evaluation for left-sided numbness,
tingling, paresthesias.

EXAM:
MRI CERVICAL SPINE WITHOUT CONTRAST
TECHNIQUE: Multiplanar, multisequence MR imaging of the cervical spine was
performed. No intravenous contrast was administered.

[Series 5: T2 · sagittal · 3.0mm · 0.69mm/px · 6 of 15 slices shown (1 of 2)]
[im 1/15]
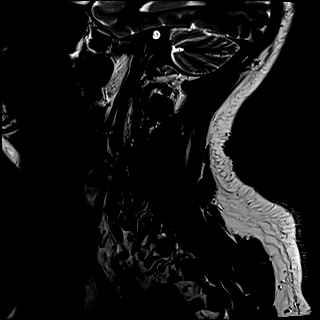
[im 3/15]
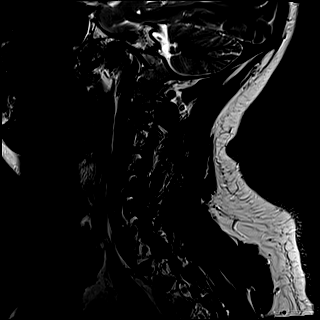
[im 6/15]
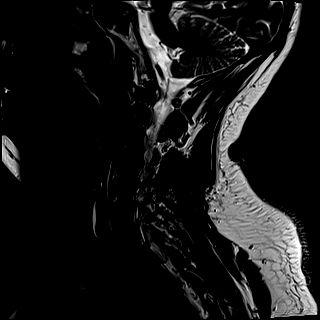
[im 9/15]
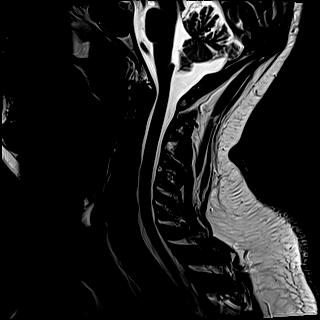
[im 12/15]
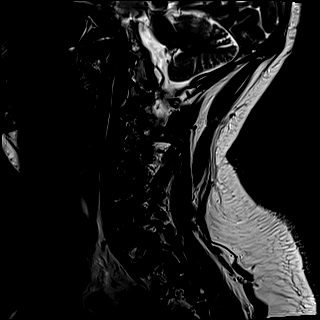
[im 15/15]
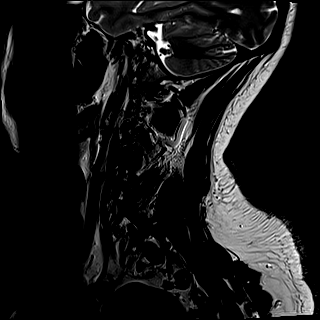

[Series 6: T1 · sagittal · 3.0mm · 0.69mm/px · 6 of 15 slices shown (1 of 2)]
[im 1/15]
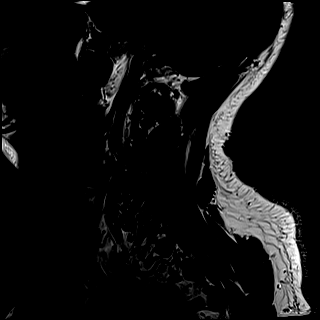
[im 3/15]
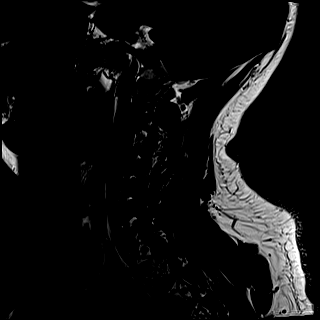
[im 6/15]
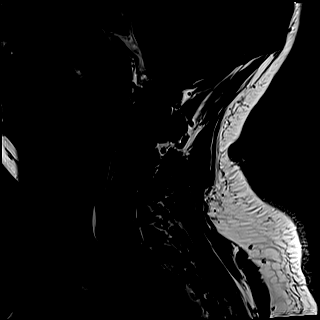
[im 9/15]
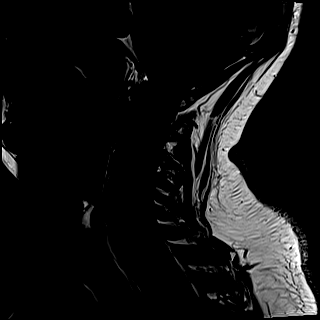
[im 12/15]
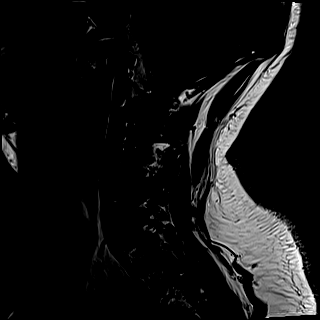
[im 15/15]
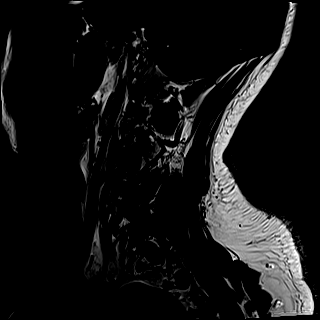

[Series 7: STIR · sagittal · 3.0mm · 0.86mm/px · 6 of 15 slices shown]
[im 1/15]
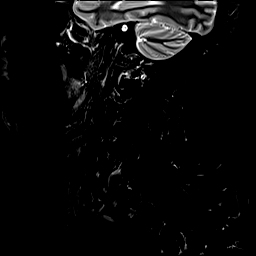
[im 3/15]
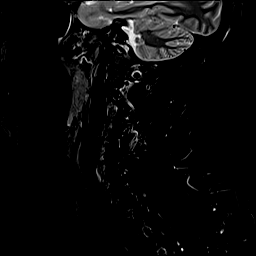
[im 6/15]
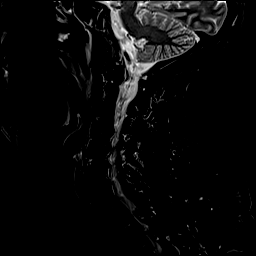
[im 9/15]
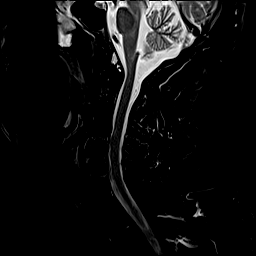
[im 12/15]
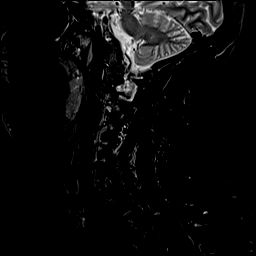
[im 15/15]
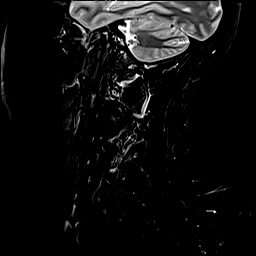

[Series 8: T2 · axial · 3.0mm · 0.70mm/px · z∈[-184,-82]mm · 8 of 26 slices shown (2 of 2)]
[im 1/26]
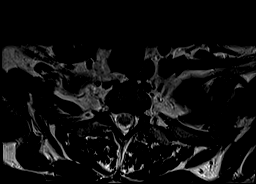
[im 3/26]
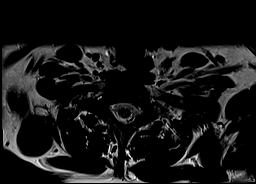
[im 9/26]
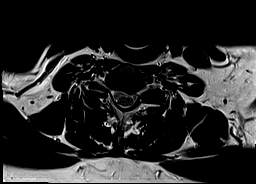
[im 12/26]
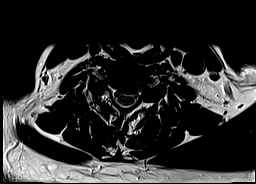
[im 14/26]
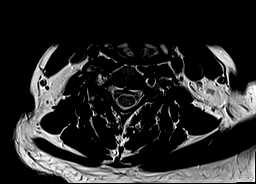
[im 17/26]
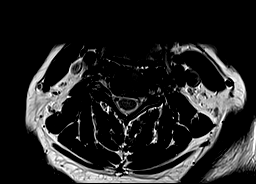
[im 23/26]
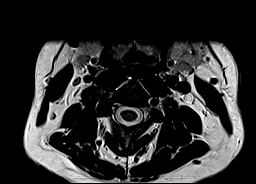
[im 26/26]
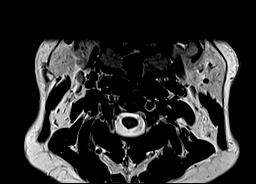

[Series 9: GRE · axial · 3.0mm · 0.35mm/px · z∈[-190,-159]mm · 3 of 27 slices shown]
[im 1/27]
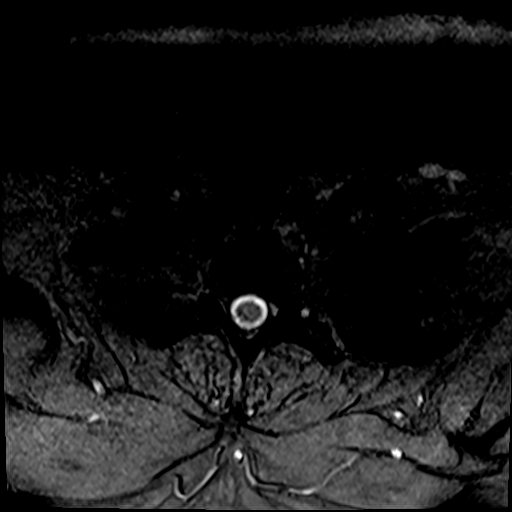
[im 3/27]
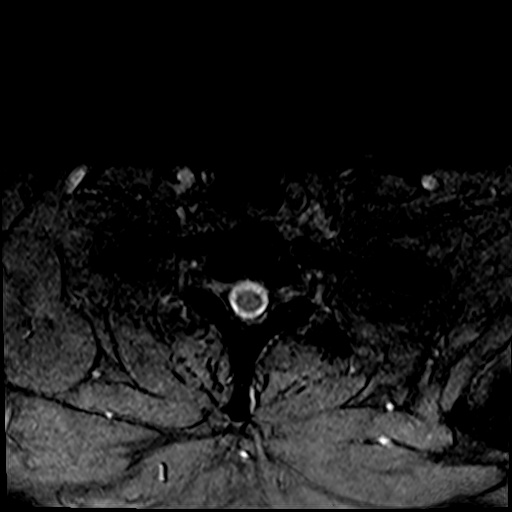
[im 9/27]
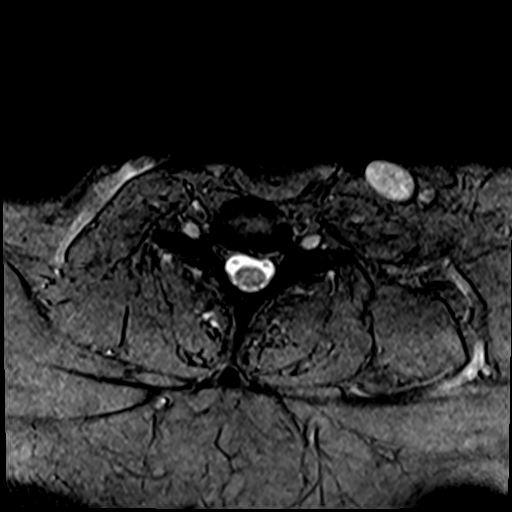

[Series 10: T1 · axial · 3.0mm · 0.35mm/px · z∈[-189,-88]mm · 8 of 27 slices shown (2 of 2)]
[im 1/27]
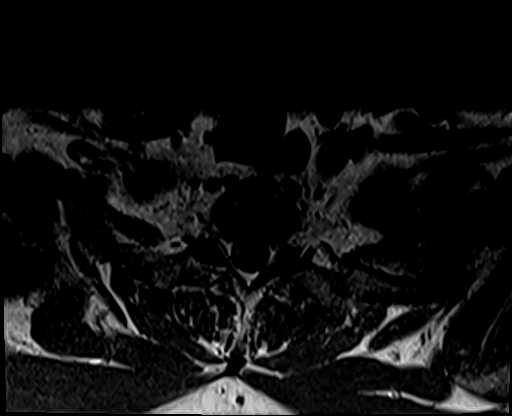
[im 3/27]
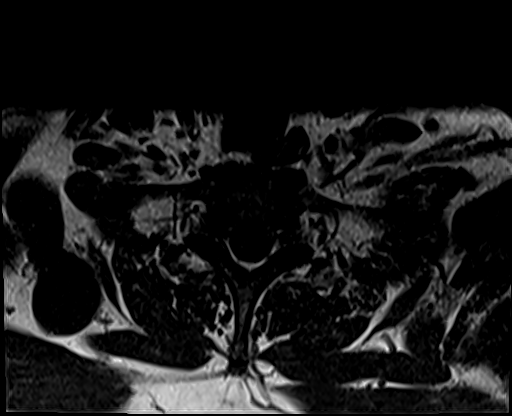
[im 9/27]
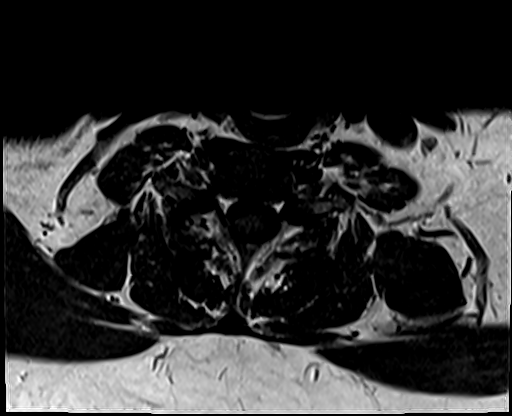
[im 12/27]
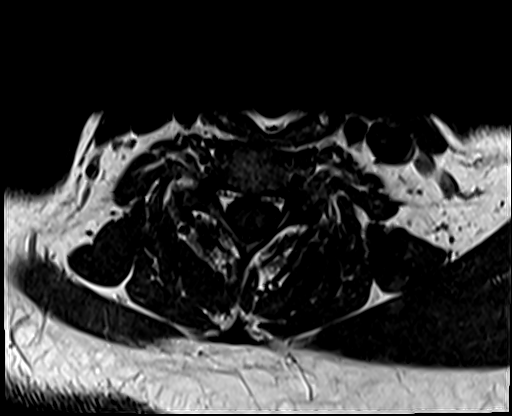
[im 15/27]
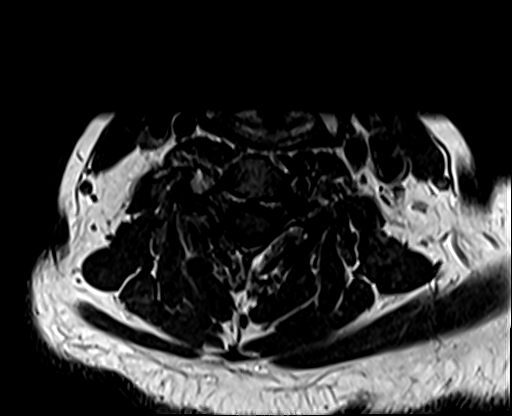
[im 18/27]
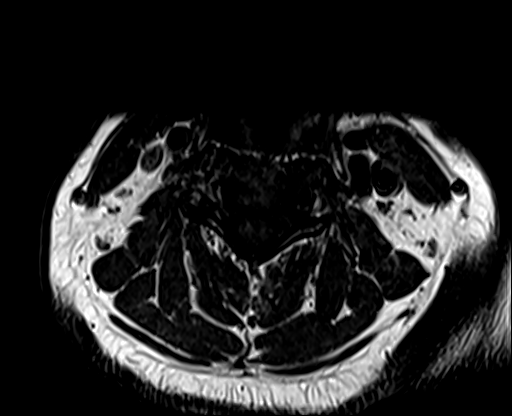
[im 24/27]
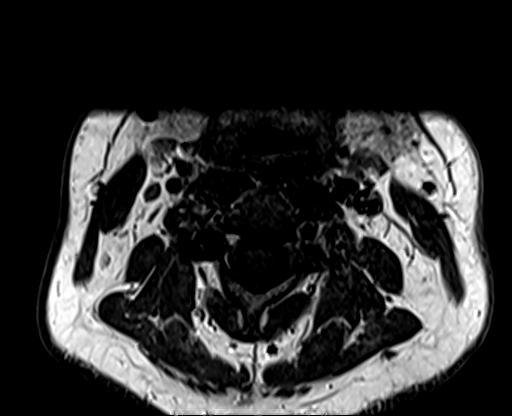
[im 27/27]
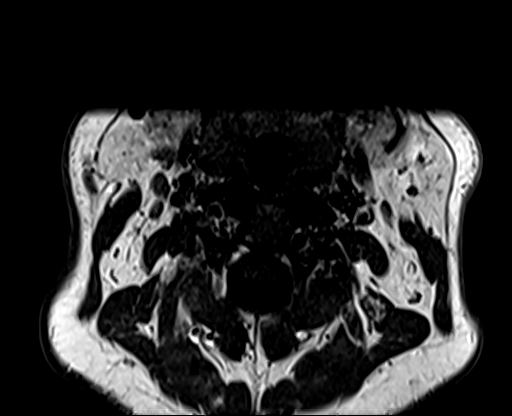

[37 of 48 positions shown; findings below may reference images not displayed]

FINDINGS: Alignment: Physiologic with preservation of the normal cervical
lordosis. No listhesis.

Vertebrae: Vertebral body height maintained without acute or chronic
fracture. Bone marrow signal intensity somewhat diffusely decreased
on T1 weighted imaging, nonspecific, but most commonly related to
anemia, smoking, or obesity. No discrete or worrisome osseous
lesions. No abnormal marrow edema.

Cord: Normal signal and morphology. No convincing cord signal
changes identified.

Posterior Fossa, vertebral arteries, paraspinal tissues: Visualized
brain and posterior fossa within normal limits. Craniocervical
junction normal. Paraspinous and prevertebral soft tissues within
normal limits. Normal intravascular flow voids seen within the
vertebral arteries bilaterally.

Disc levels:

C2-C3: Unremarkable.

C3-C4: Mild disc bulge with uncovertebral and endplate spurring.
Posterior disc osteophyte flattens and partially faces the ventral
thecal sac, slightly worse on the right. No significant spinal
stenosis or cord impingement. Mild to moderate right worse than left
C4 foraminal stenosis.

C4-C5: Degenerative intervertebral disc space narrowing with diffuse
disc osteophyte complex, slightly asymmetric to the left. Flattening
and partial effacement of the ventral thecal sac without significant
spinal stenosis. Severe left with moderate right C5 foraminal
stenosis.

C5-C6: Left paracentral disc protrusion indents the left ventral
thecal sac (series 9, image 15). Resultant mild spinal stenosis with
minimal flattening of the ventral cord, but no cord signal changes.
Superimposed uncovertebral spurring with resultant severe left with
mild-to-moderate right C6 foraminal stenosis.

C6-C7: Left paracentral disc osteophyte complex indents the left
ventral thecal sac (series 8, image 18). No significant spinal
stenosis or cord impingement. Mild-to-moderate bilateral C7
foraminal stenosis.

C7-T1: Negative interspace. Mild to moderate bilateral facet
hypertrophy. No spinal stenosis. Mild left C8 foraminal narrowing.
Right neural foramina remains patent.

Visualized upper thoracic spine demonstrates no significant finding.
IMPRESSION: 1. Left paracentral disc protrusions at C5-6 and C6-7, potentially
affecting either the left C6 or C7 nerve roots respectively.
Associated severe left C6 foraminal narrowing, with mild to moderate
bilateral C7 foraminal stenosis.
2. Left eccentric disc osteophyte at C4-5 with resultant severe left
and moderate right C5 foraminal stenosis.

## 2021-01-24 MED ORDER — METHYLPREDNISOLONE 4 MG PO TBPK: ORAL_TABLET | ORAL | 0 refills | Status: AC

## 2021-01-24 NOTE — ED Provider Notes (Signed)
Emergency Medicine Provider Triage Evaluation Note  Seth Lee , a 50 y.o. male  was evaluated in triage.  Pt complains of left arm numbness.  Symptoms started yesterday morning and have been constant.  Patient denies tingling denies any injury or pain in the arm or neck.  He reports this morning he had a little bit of dizziness or lightheadedness, he has difficulty providing more detail on this.  No visual changes, facial droop or speech changes.  No weakness in the extremities and no numbness elsewhere.  No history of similar.  Denies associated chest pain.  Review of Systems  Positive: Left arm numbness, dizziness, anxiety Negative: Chest pain, headache, weakness, vision changes, speech changes, facial droop  Physical Exam  BP (!) 168/91 (BP Location: Left Arm)   Pulse (!) 111   Temp 98.4 F (36.9 C) (Oral)   Resp 16   Ht 5\' 9"  (1.753 m)   Wt 90.7 kg   SpO2 98%   BMI 29.53 kg/m  Gen:   Awake, no distress   Resp:  Normal effort  MSK:   Moves extremities without difficulty  Other:  On exam patient has intact and equal sensation in bilateral upper and lower extremities, 5/5 strength in all extremities and no obvious focal neurologic deficits, odd sensation in the arm is not reproducible with Spurling's maneuver  Medical Decision Making  Medically screening exam initiated at 9:18 AM.  Appropriate orders placed.  was informed that the remainder of the evaluation will be completed by another provider, this initial triage assessment does not replace that evaluation, and the importance of remaining in the ED until their evaluation is complete.     Waymond Cera, PA-C 01/24/21 01/26/21    [id number], MD 01/24/21 [phone]

## 2021-01-24 NOTE — ED Provider Notes (Signed)
[facility]-EMERGENCY DEPT Provider Note   CSN: [id number] Arrival date & time: 01/24/21  6160     History Chief Complaint  Patient presents with   Numbness    Seth Lee is a 50 y.o. male.  HPI Patient reports yesterday morning he awakened with his left arm feeling numb and somewhat tingly.  He denies there was associated pain.  He denies associated neck pain.  No headache.  He reports his symptoms were fairly persistent throughout the day.  They seem to wane a little bit so he did not seek treatment.  He reports that he went to bed and woke up this morning and the symptoms were more intense than they had been.  The arm felt numb but it function normally.  No other symptoms.  He denies any lower extremity weakness numbness or tingling.  No visual changes.  No gait dysfunction.  Patient denies recent illness.  No history of stroke.  No history of any significant cervical spine problems.  Patient wondered if maybe it was due to dehydration so he tried drinking a lot of fluids but that did not seem to help.    History reviewed. No pertinent past medical history.  Patient Active Problem List   Diagnosis Date Noted   Acute respiratory failure with hypoxia (HCC) 01/30/2020   Dehydration with hyponatremia 01/30/2020   Pneumonia due to COVID-19 virus 01/30/2020   COVID-19 virus infection 01/29/2020    History reviewed. No pertinent surgical history.     Family History  Problem Relation Age of Onset   Diabetes Father    Lung cancer Maternal Grandmother    Lung cancer Maternal Grandfather     Social History   Tobacco Use   Smoking status: Former    Types: Cigarettes    Quit date: 02/14/2011    Years since quitting: 9.9   Smokeless tobacco: Never  Vaping Use   Vaping Use: Never used  Substance Use Topics   Alcohol use: Yes    Comment: occ   Drug use: Never    Home Medications Prior to Admission medications   Medication Sig Start Date End Date  Taking? Authorizing Provider  Aspirin-Acetaminophen-Caffeine (GOODYS EXTRA STRENGTH PO) Take 1 packet by mouth daily as needed (headache).   Yes [provider]  calcium carbonate (TUMS EX) 750 MG chewable tablet Chew 1 tablet by mouth daily as needed for heartburn.   Yes [provider]  methylPREDNISolone (MEDROL DOSEPAK) 4 MG TBPK tablet Per Dosepak instructions 01/24/21  Yes Ashlyne Olenick, Lebron Conners, MD  ascorbic acid (VITAMIN C) 500 MG tablet Take 1 tablet (500 mg total) by mouth daily. Patient not taking: Reported on 01/24/2021 02/06/20   Drema Dallas, MD  dexamethasone (DECADRON) 6 MG tablet Take 1 tablet (6 mg total) by mouth daily. Patient not taking: Reported on 01/24/2021 02/05/20   Drema Dallas, MD  guaiFENesin-dextromethorphan [facility] DM) 100-10 MG/5ML syrup Take 10 mLs by mouth every 4 (four) hours as needed for cough. Patient not taking: Reported on 01/24/2021 02/05/20   Drema Dallas, MD  zinc sulfate 220 (50 Zn) MG capsule Take 1 capsule (220 mg total) by mouth daily. Patient not taking: Reported on 01/24/2021 02/06/20   Drema Dallas, MD    Allergies    Patient has no known allergies.  Review of Systems   Review of Systems 10 systems reviewed and negative except as per HPI Physical Exam Updated Vital Signs BP (!) 155/101   Pulse Marland Kitchen)  55   Temp 98.4 F (36.9 C) (Oral)   Resp 19   Ht 5\' 9"  (1.753 m)   Wt 90.7 kg   SpO2 96%   BMI 29.53 kg/m   Physical Exam Constitutional:      Appearance: Normal appearance.  HENT:     Head: Normocephalic and atraumatic.     Mouth/Throat:     Mouth: Mucous membranes are moist.     Pharynx: Oropharynx is clear.  Eyes:     Extraocular Movements: Extraocular movements intact.     Conjunctiva/sclera: Conjunctivae normal.  Neck:     Comments: No decreased range of motion of the neck.  No pain with range of motion.  No central C-spine pain. Cardiovascular:     Rate and Rhythm: Normal rate and regular rhythm.   Pulmonary:     Effort: Pulmonary effort is normal.     Breath sounds: Normal breath sounds.  Abdominal:     General: There is no distension.     Palpations: Abdomen is soft.     Tenderness: There is no abdominal tenderness. There is no guarding.  Musculoskeletal:        General: Normal range of motion.     Comments: Normal range of motion at the left shoulder.  Patient does have some reproducible discomfort in the trapezius on the left.  No midline cervical spine tenderness no reproducing the symptoms with range of motion of the neck.  No edema or swelling of the arm.  Musculature well formed.  Patient is neurovascularly intact with 2+ distal pulses and brisk cap refill.  Skin:    General: Skin is warm and dry.  Neurological:     General: No focal deficit present.     Mental Status: He is alert and oriented to person, place, and time.     Cranial Nerves: No cranial nerve deficit.     Motor: No weakness.     Coordination: Coordination normal.     Comments: Patient is alert and oriented.  GCS 15.  Mental status clear.  Speech clear.  Motor strength 5\5.  Sensation intact to light touch.  Patient does not perceive sensory differential to light touch on the left upper extremity.  Psychiatric:        Mood and Affect: Mood normal.    ED Results / Procedures / Treatments   Labs (all labs ordered are listed, but only abnormal results are displayed) Labs Reviewed  BASIC METABOLIC PANEL - Abnormal; Notable for the following components:      Result Value   Glucose, Bld 116 (*)    All other components within normal limits  CBC WITH DIFFERENTIAL/PLATELET  TROPONIN I (HIGH SENSITIVITY)  TROPONIN I (HIGH SENSITIVITY)    EKG EKG Interpretation  Date/Time:  Tuesday January 24 2021 09:20:20 EDT Ventricular Rate:  76 PR Interval:  131 QRS Duration: 91 QT Interval:  354 QTC Calculation: 398 R Axis:   14 Text Interpretation: Sinus rhythm no STEMI. similar to previous study Confirmed by  09[birth year]1978 [phone]) on 01/24/2021 7:07:52 PM  Radiology DG Chest 2 View  Result Date: 01/24/2021 CLINICAL DATA:  Left arm numbness EXAM: CHEST - 2 VIEW COMPARISON:  01/29/2020 FINDINGS: The heart size and mediastinal contours are within normal limits. Both lungs are clear. The visualized skeletal structures are unremarkable. IMPRESSION: No active cardiopulmonary disease. Electronically Signed   By: 01/31/2020 M.D.   On: 01/24/2021 10:35   CT Head Wo Contrast  Result Date: 01/24/2021 CLINICAL DATA:  Neurologic deficit.  Left arm numbness. EXAM: CT HEAD WITHOUT CONTRAST TECHNIQUE: Contiguous axial images were obtained from the base of the skull through the vertex without intravenous contrast. COMPARISON:  None. FINDINGS: Brain: No evidence of acute infarction, hemorrhage, hydrocephalus, extra-axial collection or mass lesion/mass effect. Vascular: No hyperdense vessel or unexpected calcification. Skull: Normal. Negative for fracture or focal lesion. Sinuses/Orbits: No acute finding. Other: None IMPRESSION: No acute intracranial abnormalities. Normal brain. Electronically Signed   By: Signa Kell M.D.   On: 01/24/2021 10:34   MR BRAIN WO CONTRAST  Result Date: 01/24/2021 CLINICAL DATA:  Initial evaluation for neuro deficit, stroke suspected. EXAM: MRI HEAD WITHOUT CONTRAST TECHNIQUE: Multiplanar, multiecho pulse sequences of the brain and surrounding structures were obtained without intravenous contrast. COMPARISON:  Prior CT from earlier the same day. FINDINGS: Brain: Cerebral volume within normal limits for patient age. Few scattered subcentimeter foci of FLAIR hyperintensity noted involving the supratentorial cerebral white matter of the left cerebral hemisphere, nonspecific, but overall minimal in nature in felt to be within normal limits for age. No abnormal foci of restricted diffusion to suggest acute or subacute ischemia. Gray-white matter differentiation well maintained. No  encephalomalacia to suggest chronic infarction. No foci of susceptibility artifact to suggest acute or chronic intracranial hemorrhage. No mass lesion, midline shift or mass effect. No hydrocephalus. No extra-axial fluid collection. Pituitary gland and suprasellar region are normal. Midline structures intact and normal. Vascular: Major intracranial vascular flow voids well maintained and normal in appearance. Skull and upper cervical spine: Craniocervical junction normal. Visualized upper cervical spine within normal limits. Bone marrow signal intensity normal. No scalp soft tissue abnormality. Sinuses/Orbits: Globes and orbital soft tissues within normal limits. Few small maxillary sinus retention cyst noted. Paranasal sinuses are otherwise clear. No mastoid effusion. Inner ear structures grossly normal. Other: 7 mm cystic lesion noted at the left periauricular soft tissues (series 8, image 6), nonspecific, but of doubtful significance. IMPRESSION: Normal brain MRI for age. No acute intracranial abnormality identified. Electronically Signed   By: Rise Mu M.D.   On: 01/24/2021 23:07   MR Cervical Spine Wo Contrast  Result Date: 01/24/2021 CLINICAL DATA:  Initial evaluation for left-sided numbness, tingling, paresthesias. EXAM: MRI CERVICAL SPINE WITHOUT CONTRAST TECHNIQUE: Multiplanar, multisequence MR imaging of the cervical spine was performed. No intravenous contrast was administered. COMPARISON:  None. FINDINGS: Alignment: Physiologic with preservation of the normal cervical lordosis. No listhesis. Vertebrae: Vertebral body height maintained without acute or chronic fracture. Bone marrow signal intensity somewhat diffusely decreased on T1 weighted imaging, nonspecific, but most commonly related to anemia, smoking, or obesity. No discrete or worrisome osseous lesions. No abnormal marrow edema. Cord: Normal signal and morphology. No convincing cord signal changes identified. Posterior Fossa,  vertebral arteries, paraspinal tissues: Visualized brain and posterior fossa within normal limits. Craniocervical junction normal. Paraspinous and prevertebral soft tissues within normal limits. Normal intravascular flow voids seen within the vertebral arteries bilaterally. Disc levels: C2-C3: Unremarkable. C3-C4: Mild disc bulge with uncovertebral and endplate spurring. Posterior disc osteophyte flattens and partially faces the ventral thecal sac, slightly worse on the right. No significant spinal stenosis or cord impingement. Mild to moderate right worse than left C4 foraminal stenosis. C4-C5: Degenerative intervertebral disc space narrowing with diffuse disc osteophyte complex, slightly asymmetric to the left. Flattening and partial effacement of the ventral thecal sac without significant spinal stenosis. Severe left with moderate right C5 foraminal stenosis. C5-C6: Left paracentral disc protrusion indents the left ventral thecal sac (series 9, image  15). Resultant mild spinal stenosis with minimal flattening of the ventral cord, but no cord signal changes. Superimposed uncovertebral spurring with resultant severe left with mild-to-moderate right C6 foraminal stenosis. C6-C7: Left paracentral disc osteophyte complex indents the left ventral thecal sac (series 8, image 18). No significant spinal stenosis or cord impingement. Mild-to-moderate bilateral C7 foraminal stenosis. C7-T1: Negative interspace. Mild to moderate bilateral facet hypertrophy. No spinal stenosis. Mild left C8 foraminal narrowing. Right neural foramina remains patent. Visualized upper thoracic spine demonstrates no significant finding. IMPRESSION: 1. Left paracentral disc protrusions at C5-6 and C6-7, potentially affecting either the left C6 or C7 nerve roots respectively. Associated severe left C6 foraminal narrowing, with mild to moderate bilateral C7 foraminal stenosis. 2. Left eccentric disc osteophyte at C4-5 with resultant severe left and  moderate right C5 foraminal stenosis. Electronically Signed   By: Rise Mu M.D.   On: 01/24/2021 22:57    Procedures Procedures   Medications Ordered in ED Medications - No data to display  ED Course  I have reviewed the triage vital signs and the nursing notes.  Pertinent labs & imaging results that were available during my care of the patient were reviewed by me and considered in my medical decision making (see chart for details).    MDM Rules/Calculators/A&P                           Patient left arm numbness starting yesterday.  MRI rules out intracerebral hemorrhage or stroke.  MRI of the C-spine confirms cervical radiculopathy.  Return precautions provided. Final Clinical Impression(s) / ED Diagnoses Final diagnoses:  Cervical radiculopathy  Left sided numbness    Rx / DC Orders ED Discharge Orders          Ordered    methylPREDNISolone (MEDROL DOSEPAK) 4 MG TBPK tablet        01/24/21 2342             Arby Barrette, MD 01/24/21 2346

## 2021-01-24 NOTE — Discharge Instructions (Addendum)
1.  You have got pinched nerves in your neck.  This is causing intermittent numbness to your arm.  Take the steroid pack as prescribed. 2.  You must schedule follow-up appointment with the neurosurgery group.  The contact information is your discharge instructions.  Try to schedule an appointment as soon as possible for recheck. 3.  Return to the emergency department immediately if you get persisting worsening numbness and weakness.

## 2021-01-24 NOTE — ED Triage Notes (Addendum)
Pt states that he has had L sided arm numbness since yesterday morning. States that it stops if he moves his arm all the way up. Dizziness this morning. Neuro intact. Alert and oriented.

## 2021-01-24 NOTE — ED Notes (Signed)
Contacted MRI per Pfeiffer MD request Estimated time for MRI 8:30-8:45 pm
